# Patient Record
Sex: Female | Born: 1990 | Hispanic: Yes | Marital: Married | Smoking: Current some day smoker
Health system: Southern US, Community
[De-identification: ages and names within clinical notes are randomized; demographics above are authoritative.]

## PROBLEM LIST (undated history)

## (undated) DIAGNOSIS — Z789 Other specified health status: Secondary | ICD-10-CM

## (undated) HISTORY — PX: NO PAST SURGERIES: SHX2092

## (undated) HISTORY — DX: Other specified health status: Z78.9

---

## 2020-11-08 ENCOUNTER — Emergency Department (HOSPITAL_COMMUNITY)
Admission: EM | Admit: 2020-11-08 | Discharge: 2020-11-09 | Disposition: A | Discharge status: Home / Self Care | Payer: Self-pay | Attending: Emergency Medicine | Admitting: Emergency Medicine

## 2020-11-08 ENCOUNTER — Emergency Department (HOSPITAL_COMMUNITY): Payer: Self-pay

## 2020-11-08 DIAGNOSIS — N3090 Cystitis, unspecified without hematuria: Secondary | ICD-10-CM | POA: Insufficient documentation

## 2020-11-08 DIAGNOSIS — N760 Acute vaginitis: Secondary | ICD-10-CM | POA: Insufficient documentation

## 2020-11-08 DIAGNOSIS — N83201 Unspecified ovarian cyst, right side: Secondary | ICD-10-CM | POA: Insufficient documentation

## 2020-11-08 DIAGNOSIS — R1032 Left lower quadrant pain: Secondary | ICD-10-CM | POA: Insufficient documentation

## 2020-11-08 DIAGNOSIS — B9689 Other specified bacterial agents as the cause of diseases classified elsewhere: Secondary | ICD-10-CM | POA: Insufficient documentation

## 2020-11-08 DIAGNOSIS — M545 Low back pain, unspecified: Secondary | ICD-10-CM | POA: Insufficient documentation

## 2020-11-08 LAB — URINALYSIS, ROUTINE W REFLEX MICROSCOPIC
Bilirubin Urine: NEGATIVE
Glucose, UA: NEGATIVE mg/dL
Ketones, ur: NEGATIVE mg/dL
Nitrite: NEGATIVE
Protein, ur: NEGATIVE mg/dL
Specific Gravity, Urine: 1.02 (ref 1.005–1.030)
pH: 5 (ref 5.0–8.0)

## 2020-11-08 LAB — CBC WITH DIFFERENTIAL/PLATELET
Abs Immature Granulocytes: 0.03 10*3/uL (ref 0.00–0.07)
Basophils Absolute: 0.1 10*3/uL (ref 0.0–0.1)
Basophils Relative: 1 %
Eosinophils Absolute: 0.2 10*3/uL (ref 0.0–0.5)
Eosinophils Relative: 2 %
HCT: 40.3 % (ref 36.0–46.0)
Hemoglobin: 13 g/dL (ref 12.0–15.0)
Immature Granulocytes: 0 %
Lymphocytes Relative: 39 %
Lymphs Abs: 3.5 10*3/uL (ref 0.7–4.0)
MCH: 27.1 pg (ref 26.0–34.0)
MCHC: 32.3 g/dL (ref 30.0–36.0)
MCV: 84 fL (ref 80.0–100.0)
Monocytes Absolute: 0.4 10*3/uL (ref 0.1–1.0)
Monocytes Relative: 4 %
Neutro Abs: 4.9 10*3/uL (ref 1.7–7.7)
Neutrophils Relative %: 54 %
Platelets: 276 10*3/uL (ref 150–400)
RBC: 4.8 MIL/uL (ref 3.87–5.11)
RDW: 13.7 % (ref 11.5–15.5)
WBC: 9.1 10*3/uL (ref 4.0–10.5)
nRBC: 0 % (ref 0.0–0.2)

## 2020-11-08 LAB — WET PREP, GENITAL
Sperm: NONE SEEN
Trich, Wet Prep: NONE SEEN
Yeast Wet Prep HPF POC: NONE SEEN

## 2020-11-08 LAB — COMPREHENSIVE METABOLIC PANEL
ALT: 19 U/L (ref 0–44)
AST: 19 U/L (ref 15–41)
Albumin: 3.5 g/dL (ref 3.5–5.0)
Alkaline Phosphatase: 67 U/L (ref 38–126)
Anion gap: 9 (ref 5–15)
BUN: 8 mg/dL (ref 6–20)
CO2: 24 mmol/L (ref 22–32)
Calcium: 8.8 mg/dL — ABNORMAL LOW (ref 8.9–10.3)
Chloride: 103 mmol/L (ref 98–111)
Creatinine, Ser: 0.56 mg/dL (ref 0.44–1.00)
GFR, Estimated: >60 mL/min (ref >60)
Glucose, Bld: 94 mg/dL (ref 70–99)
Potassium: 4.1 mmol/L (ref 3.5–5.1)
Sodium: 136 mmol/L (ref 135–145)
Total Bilirubin: 0.6 mg/dL (ref 0.3–1.2)
Total Protein: 6.7 g/dL (ref 6.5–8.1)

## 2020-11-08 LAB — LIPASE, BLOOD: Lipase: 29 U/L (ref 11–51)

## 2020-11-08 LAB — I-STAT BETA HCG BLOOD, ED (MC, WL, AP ONLY): I-stat hCG, quantitative: <5 m[IU]/mL (ref <5)

## 2020-11-08 MED ORDER — KETOROLAC TROMETHAMINE 60 MG/2ML IM SOLN
60.0000 mg | Freq: Once | INTRAMUSCULAR | Status: AC
  Administered 2020-11-09: 60 mg via INTRAMUSCULAR
  Filled 2020-11-08: qty 2

## 2020-11-08 NOTE — ED Provider Notes (Signed)
Emergency Medicine Provider Triage Evaluation Note  Chloe Sullivan , a 30 y.o. female  was evaluated in triage.  Pt complains of LLQ abdominal pain x2 weeks associated with increased white vaginal discharge and urinary frequency. No previous abdominal operations. She is currently sexually active with 1 partner with no concerns for STDs. No fever or chills. Admits to nausea, but denies emesis and diarrhea.   Review of Systems  Positive: Abdominal pain, nausea Negative: fever  Physical Exam  BP 134/81   Pulse 61   Temp 98.2 F (36.8 C) (Oral)   Resp 14   SpO2 99%  Gen:   Awake, no distress   Resp:  Normal effort  MSK:   Moves extremities without difficulty  Other:  Mild LLQ TTP without rebound or guarding  Medical Decision Making  Medically screening exam initiated at 6:30 PM.  Appropriate orders placed.  Aliyha Franqui was informed that the remainder of the evaluation will be completed by another provider, this initial triage assessment does not replace that evaluation, and the importance of remaining in the ED until their evaluation is complete.  Abdominal labs ordered.   Jesusita Oka 11/08/20 1831    Linwood Dibbles, MD 11/11/20 [phone]

## 2020-11-08 NOTE — ED Triage Notes (Signed)
Pt c/o LLQ abd pain x2wks, lower back pain, urinary frequency. OTC helps w mild pain7/10 pressure LLQ

## 2020-11-08 NOTE — ED Provider Notes (Signed)
[facility] EMERGENCY DEPARTMENT Provider Note   CSN: [id number] Arrival date & time: 11/08/20  1807     History Chief Complaint  Patient presents with   Abdominal Pain    Chloe Sullivan is a 30 y.o. female.  The history is provided by the patient.  Abdominal Pain Chloe Sullivan is a 30 y.o. female who presents to the Emergency Department complaining of abdominal pain. She presents the emergency department complaining of two weeks of left lower quadrant abdominal pain. Pain is cramping in nature and radiates to her left side low back. She reports two days of associated urinary frequency as well as white vaginal discharge. No fevers, nausea, vomiting. She has no known medical problems and takes no medications. She does have a history of similar pain in the past related to a tubal pregnancy. No new sexual partners.    No past medical history on file.  There are no problems to display for this patient.      OB History   No obstetric history on file.     No family history on file.     Home Medications Prior to Admission medications   Medication Sig Start Date End Date Taking? Authorizing Provider  metroNIDAZOLE (FLAGYL) 500 MG tablet Take 1 tablet (500 mg total) by mouth 2 (two) times daily. 11/09/20  Yes Tilden Fossa, MD  nitrofurantoin, macrocrystal-monohydrate, (MACROBID) 100 MG capsule Take 1 capsule (100 mg total) by mouth 2 (two) times daily. 11/09/20  Yes Tilden Fossa, MD    Allergies    Patient has no allergy information on record.  Review of Systems   Review of Systems  Gastrointestinal:  Positive for abdominal pain.  All other systems reviewed and are negative.  Physical Exam Updated Vital Signs BP 114/75   Pulse 69   Temp 98.2 F (36.8 C) (Oral)   Resp (!) 26   SpO2 99%   Physical Exam Vitals and nursing note reviewed.  Constitutional:      Appearance: She is well-developed.  HENT:     Head: Normocephalic and  atraumatic.  Cardiovascular:     Rate and Rhythm: Normal rate and regular rhythm.  Pulmonary:     Effort: Pulmonary effort is normal. No respiratory distress.  Abdominal:     Palpations: Abdomen is soft.     Tenderness: There is no guarding or rebound.     Comments: Mild LLQ and suprapubic tenderness  Genitourinary:    Comments: Scant vaginal bleeding.  No CMT. Minimal left adnexal tenderness without fullness Musculoskeletal:        General: No tenderness.  Skin:    General: Skin is warm and dry.  Neurological:     Mental Status: She is alert and oriented to person, place, and time.  Psychiatric:        Behavior: Behavior normal.    ED Results / Procedures / Treatments   Labs (all labs ordered are listed, but only abnormal results are displayed) Labs Reviewed  WET PREP, GENITAL - Abnormal; Notable for the following components:      Result Value   Clue Cells Wet Prep HPF POC PRESENT (*)    WBC, Wet Prep HPF POC MANY (*)    All other components within normal limits  URINALYSIS, ROUTINE W REFLEX MICROSCOPIC - Abnormal; Notable for the following components:   APPearance HAZY (*)    Hgb urine dipstick LARGE (*)    Leukocytes,Ua SMALL (*)    Bacteria, UA RARE (*)  All other components within normal limits  COMPREHENSIVE METABOLIC PANEL - Abnormal; Notable for the following components:   Calcium 8.8 (*)    All other components within normal limits  CBC WITH DIFFERENTIAL/PLATELET  LIPASE, BLOOD  I-STAT BETA HCG BLOOD, ED ([facility], [facility], [facility] ONLY)  GC/CHLAMYDIA PROBE AMP ([place]) NOT AT [facility]    EKG None  Radiology CT Renal Stone Study  Result Date: 11/08/2020 CLINICAL DATA:  Flank pain, renal stone suspected. EXAM: CT ABDOMEN AND PELVIS WITHOUT CONTRAST TECHNIQUE: Multidetector CT imaging of the abdomen and pelvis was performed following the standard protocol without IV contrast. COMPARISON:  None. FINDINGS: Lower chest: Hypoventilatory change in the dependent lungs.  Hepatobiliary: Unremarkable noncontrast appearance of the hepatic parenchyma. Gallbladder is unremarkable. No biliary ductal dilation. Pancreas: Within normal limits. Spleen: Within normal. Adrenals/Urinary Tract: Adrenal glands are unremarkable. Kidneys are normal, without renal calculi, contour deforming lesion, or hydronephrosis. Bladder is unremarkable for degree of distension. Stomach/Bowel: Stomach is within normal limits. Appendix appears normal. No evidence of bowel wall thickening, distention, or inflammatory changes. Vascular/Lymphatic: No significant vascular findings are present. No enlarged abdominal or pelvic lymph nodes. Reproductive: Uterus is unremarkable. Multiple small cystic areas in the cervix measuring to 1.2 cm likely representing nabothian cysts. Left ovary is unremarkable. Simple appearing 4.3 cm right ovarian cyst. No follow-up imaging recommended. Note: This recommendation does not apply to premenarchal patients and to those with increased risk (genetic, family history, elevated tumor markers or other high-risk factors) of ovarian cancer. Reference: JACR 2020 Feb; 17(2):248-254 Other: No abdominopelvic ascites. Musculoskeletal: No acute or significant osseous findings. IMPRESSION: 1. No acute abdominopelvic findings. Specifically, no evidence of nephrolithiasis or obstructive uropathy. 2. Simple appearing 4.3 cm right ovarian cyst. No follow-up imaging recommended. Note: This recommendation does not apply to premenarchal patients and to those with increased risk (genetic, family history, elevated tumor markers or other high-risk factors) of ovarian cancer. Reference: JACR 2020 Feb; 17(2):248-254 Electronically Signed   By: Maudry Mayhew MD   On: 11/08/2020 23:49    Procedures Procedures   Medications Ordered in ED Medications  ketorolac (TORADOL) injection 60 mg (has no administration in time range)    ED Course  I have reviewed the triage vital signs and the nursing  notes.  Pertinent labs & imaging results that were available during my care of the patient were reviewed by me and considered in my medical decision making (see chart for details).    MDM Rules/Calculators/A&P                         patient here for evaluation of two weeks of left lower quadrant pain that radiates to her back as well as two days of vaginally discharge. She does have mild urinary frequency. UA with few bacteria WBCs in setting of symptoms will treat for cystitis. CT scan was obtained to rule out stone. CT scan does demonstrate a right ovarian cyst. Presentation is not consistent with tubo ovarian abscess or torsion. What prep is significant for BV - will treat. Discussed with patient home care for cystitis, BV and ovarian cyst. Discussed outpatient follow-up and return precautions.  Final Clinical Impression(s) / ED Diagnoses Final diagnoses:  Cystitis  BV (bacterial vaginosis)  Cyst of right ovary    Rx / DC Orders ED Discharge Orders          Ordered    nitrofurantoin, macrocrystal-monohydrate, (MACROBID) 100 MG capsule  2 times daily  11/09/20 0005    metroNIDAZOLE (FLAGYL) 500 MG tablet  2 times daily        11/09/20 0005             Tilden Fossa, MD 11/09/20 0009

## 2020-11-09 LAB — GC/CHLAMYDIA PROBE AMP (~~LOC~~) NOT AT ARMC
Chlamydia: NEGATIVE
Comment: NEGATIVE
Comment: NORMAL
Neisseria Gonorrhea: NEGATIVE

## 2020-11-09 MED ORDER — NITROFURANTOIN MONOHYD MACRO 100 MG PO CAPS: 100.0000 mg | ORAL_CAPSULE | Freq: Two times a day (BID) | ORAL | 0 refills | Status: DC

## 2020-11-09 MED ORDER — METRONIDAZOLE 500 MG PO TABS: 500.0000 mg | ORAL_TABLET | Freq: Two times a day (BID) | ORAL | 0 refills | Status: DC

## 2020-11-12 ENCOUNTER — Ambulatory Visit (INDEPENDENT_AMBULATORY_CARE_PROVIDER_SITE_OTHER): Payer: Self-pay | Admitting: Family Medicine

## 2020-11-12 ENCOUNTER — Other Ambulatory Visit: Payer: Self-pay

## 2020-11-12 VITALS — HR 74 | Ht 65.0 in | Wt 248.6 lb

## 2020-11-12 DIAGNOSIS — Z3169 Encounter for other general counseling and advice on procreation: Secondary | ICD-10-CM

## 2020-11-12 DIAGNOSIS — G43919 Migraine, unspecified, intractable, without status migrainosus: Secondary | ICD-10-CM

## 2020-11-12 DIAGNOSIS — N83201 Unspecified ovarian cyst, right side: Secondary | ICD-10-CM

## 2020-11-12 DIAGNOSIS — Z6841 Body Mass Index (BMI) 40.0 and over, adult: Secondary | ICD-10-CM

## 2020-11-12 MED ORDER — MAGNESIUM 400 MG PO CAPS: 400.0000 mg | ORAL_CAPSULE | Freq: Every day | ORAL | 0 refills | Status: DC | PRN

## 2020-11-12 NOTE — Progress Notes (Signed)
    SUBJECTIVE:   Chief compliant/HPI: annual examination  Chloe Sullivan is a 30 y.o. who presents today to establish care.   She reports she has been trying to get pregnant for past 3 years. Had hx of ectopic pregnancy in 2017. States she has a 72 year old son. Partner has not been evaluated  Endorses cyst in R ovary but L side has been bothering her.   Hx of migraines sicne 45-51 years old. Takes Ibuprofen as needed for pain. Has about 3 migraines a week that last 20-30 minutes. Feels nauseous during episodes, occasionally throws up.Worsened with light and sound. Location differs- sometimes behind eyes, sometimes 1 side of head   Endorses Cigarettes 2-3 a month for less than 6 months. I beer when she drinks socialy . No recreational driug use  Physical activity during the week with walking and running Eats balanced meals    Denies anxiety or depression   Last pap smear in 2020  No hx breast cancer   Denies taking any medication PMH: mom with high cholesterol   OBJECTIVE:   Pulse 74   Ht 5\' 5"  (1.651 m)   Wt 248 lb 9.6 oz (112.8 kg)   LMP 11/06/2020   SpO2 98%   BMI 41.37 kg/m   General: alert, well appearing, NAD CV: RRR no murmurs Resp: CTAB normal WOB GI: soft, non distended   ASSESSMENT/PLAN:   No problem-specific Assessment & Plan notes found for this encounter.   Annual Examination  See AVS for age appropriate recommendations.   PHQ score reviewed and discussed. Blood pressure reviewed and at goal.  The patient currently does not use contraception and is trying to get pregnant. Will recommend folate at follow up visit, minimum of 400 mcg per day.   Considered the following items based upon USPSTF recommendations: HIV testing: ordered Hepatitis C: ordered Syphilis if at high risk: N/A GC/CT not at high risk and not ordered. Lipid panel (nonfasting or fasting) discussed based upon AHA recommendations and ordered.  Consider repeat every 4-6 years.   Reviewed risk factors for latent tuberculosis and not indicated  Cervical cancer screening: Awaiting records from prior pap. If unable to obtain, discussed doing pap at follow up  visit  Immunizations: none   Migraines Patient reports about 3 a week that cause nausea, vomiting, and significant pain. Not relieved with Ibuprofen. Prescribed magnesium 400mg  daily initially, and as headaches improve to take it as needed   Infertility  Patient has been trying for 3 years to get pregnant. Checking TSH. Referring to Reproductive Endocrinology   Health maintenance A1c and lipid panel given risk factors of obesity and family history. Also checking HIV and Hep C  Follow up in 2-4 weeks for pap smear    01/07/2021, DO [facility]

## 2020-11-12 NOTE — Patient Instructions (Addendum)
It was great seeing you today!  For your migraine I have prescribed magnesium to take daily for the first few days, and then you can switch to as needed for your migraines.  I have referred you to a fertility specialist for you and your husband to see.   We are checking some labs today: A1c to check your sugar, lipid panel for cholesterol, and TSH to check your thyroid. We are also taking care of your recommended screenigs: HIV, Hepatitis C  Please check-out at the front desk before leaving the clinic. I'd like to see you back in the next couple of weeks to do your pap smear, go over labs, and finish anything we did not get to etoday, but if you need to be seen earlier than that for any new issues we're happy to fit you in, just give [place] a call!  Visit Reminders: - Stop by the pharmacy to pick up your prescriptions  - Continue to work on your healthy eating habits and incorporating exercise into your daily life.   If you haven't already, sign up for My Chart to have easy access to your labs results, and communication with your primary care physician.  Feel free to call with any questions or concerns at any time, at [phone].   Take care,  Dr. Cora Collum [facility]

## 2020-11-13 LAB — LIPID PANEL
Chol/HDL Ratio: 4.3 ratio (ref 0.0–4.4)
Cholesterol, Total: 206 mg/dL — ABNORMAL HIGH (ref 100–199)
HDL: 48 mg/dL (ref >39)
LDL Chol Calc (NIH): 132 mg/dL — ABNORMAL HIGH (ref 0–99)
Triglycerides: 148 mg/dL (ref 0–149)
VLDL Cholesterol Cal: 26 mg/dL (ref 5–40)

## 2020-11-13 LAB — HCV INTERPRETATION

## 2020-11-13 LAB — HEMOGLOBIN A1C
Est. average glucose Bld gHb Est-mCnc: 111 mg/dL
Hgb A1c MFr Bld: 5.5 % (ref 4.8–5.6)

## 2020-11-13 LAB — HIV ANTIBODY (ROUTINE TESTING W REFLEX): HIV Screen 4th Generation wRfx: NONREACTIVE

## 2020-11-13 LAB — HCV AB W REFLEX TO QUANT PCR: HCV Ab: 0.2 s/co ratio (ref 0.0–0.9)

## 2020-11-13 LAB — TSH: TSH: 2.87 u[IU]/mL (ref 0.450–4.500)

## 2020-11-14 DIAGNOSIS — G43909 Migraine, unspecified, not intractable, without status migrainosus: Secondary | ICD-10-CM | POA: Insufficient documentation

## 2020-11-14 DIAGNOSIS — N83209 Unspecified ovarian cyst, unspecified side: Secondary | ICD-10-CM | POA: Insufficient documentation

## 2020-12-04 NOTE — Progress Notes (Signed)
    SUBJECTIVE:   CHIEF COMPLAINT / HPI:   Chloe Sullivan is a 30 yo who presents for pap smear. She still states she has ongoing L sided pain that radiates to her back for the past 4 weeks. Endorses burning with urinatinon, frequency, urgency.  Denies issues with bowel movements.  Completed Macrobid and Flagyl but still having flank pain and urinary symptoms. States she was having discharge prior to abx but that has resolved. States LMP on 7/11. Took pregnancy test 2 days ago and was negative. States her last cycle was late 5 days.    OBJECTIVE:   BP 127/63   Pulse 61   Wt 251 lb 3.2 oz (113.9 kg)   LMP 11/06/2020   SpO2 99%   BMI 41.80 kg/m    Physical exam  General: well appearing, NAD Cardiovascular: RRR, no murmurs Lungs: CTAB. Normal WOB Abdomen: soft, non-distended. Tender to palpation in LLQ without guarding or rebound tenderness. No CVA tenderness  Skin: warm, dry. No edema  GU: normal external vaginal tissue. Vulva normal. Cevix with mild erythema around cervical os without clear lesions or discharge. Manual exam tender on L side   ASSESSMENT/PLAN:   No problem-specific Assessment & Plan notes found for this encounter.  Dysuria, frequency, urgency, L flank pain Was recently treated for UTI a couple of weeks ago with Macrobid, but still having urinary symptoms and flank pain.  Symptoms potentially due to untreated UTI, or STIs though recently negative. Performed UA in clinic which did show some leukocytes, and will treat with Keflex twice a day for 7 days.  Pregnancy test performed which was negative.  Advised patient to let me know if symptoms persist despite taking this antibiotic.  Pap smear performed without any abnormal lesions on exam. GC and wet prep performed as well.   Follow up in 1 month unless symptoms worsen and needs to be seen sooner   Cora Collum, DO [facility]

## 2020-12-05 ENCOUNTER — Other Ambulatory Visit (HOSPITAL_COMMUNITY)
Admission: RE | Admit: 2020-12-05 | Discharge: 2020-12-05 | Disposition: A | Discharge status: Home / Self Care | Payer: Medicaid Other | Source: Ambulatory Visit | Attending: Family Medicine | Admitting: Family Medicine

## 2020-12-05 ENCOUNTER — Ambulatory Visit (INDEPENDENT_AMBULATORY_CARE_PROVIDER_SITE_OTHER): Payer: Self-pay | Admitting: Family Medicine

## 2020-12-05 ENCOUNTER — Other Ambulatory Visit: Payer: Self-pay

## 2020-12-05 VITALS — BP 127/63 | HR 61 | Wt 251.2 lb

## 2020-12-05 DIAGNOSIS — R300 Dysuria: Secondary | ICD-10-CM | POA: Insufficient documentation

## 2020-12-05 LAB — POCT URINALYSIS DIP (MANUAL ENTRY)
Bilirubin, UA: NEGATIVE
Glucose, UA: NEGATIVE mg/dL
Ketones, POC UA: NEGATIVE mg/dL
Nitrite, UA: NEGATIVE
Protein Ur, POC: NEGATIVE mg/dL
Spec Grav, UA: 1.025 (ref 1.010–1.025)
Urobilinogen, UA: 0.2 E.U./dL
pH, UA: 6 (ref 5.0–8.0)

## 2020-12-05 LAB — POCT WET PREP (WET MOUNT)
Clue Cells Wet Prep Whiff POC: NEGATIVE
Trichomonas Wet Prep HPF POC: ABSENT

## 2020-12-05 LAB — POCT URINE PREGNANCY: Preg Test, Ur: NEGATIVE

## 2020-12-05 LAB — POCT UA - MICROSCOPIC ONLY

## 2020-12-05 NOTE — Patient Instructions (Addendum)
It was great seeing you today! Today you came in for your pap smear and STI testing. I also checked your urine for potential untreated infection. I will call you once I have received all of the results and will send treatment accordingly.   Please check-out at the front desk before leaving the clinic. I'd like to see you back in about 1 month for a follow up on your pain, but if you need to be seen earlier than that for any new issues we're happy to fit you in, just give [place] a call!  If you haven't already, sign up for My Chart to have easy access to your labs results, and communication with your primary care physician.  Feel free to call with any questions or concerns at any time, at [phone].   Take care,  Dr. Cora Collum [facility]

## 2020-12-06 MED ORDER — CEPHALEXIN 500 MG PO CAPS: 500.0000 mg | ORAL_CAPSULE | Freq: Two times a day (BID) | ORAL | 0 refills | Status: AC

## 2020-12-07 LAB — CYTOLOGY - PAP
Chlamydia: NEGATIVE
Comment: NEGATIVE
Comment: NEGATIVE
Comment: NORMAL
Diagnosis: NEGATIVE
High risk HPV: NEGATIVE
Neisseria Gonorrhea: NEGATIVE

## 2020-12-08 LAB — URINE CULTURE

## 2021-01-08 ENCOUNTER — Ambulatory Visit (INDEPENDENT_AMBULATORY_CARE_PROVIDER_SITE_OTHER): Payer: Self-pay | Admitting: Family Medicine

## 2021-01-08 ENCOUNTER — Other Ambulatory Visit: Payer: Self-pay

## 2021-01-08 ENCOUNTER — Encounter: Payer: Self-pay | Admitting: Family Medicine

## 2021-01-08 VITALS — BP 115/61 | HR 72 | Ht 65.0 in | Wt 252.0 lb

## 2021-01-08 DIAGNOSIS — N912 Amenorrhea, unspecified: Secondary | ICD-10-CM

## 2021-01-08 DIAGNOSIS — N926 Irregular menstruation, unspecified: Secondary | ICD-10-CM

## 2021-01-08 LAB — POCT URINE PREGNANCY: Preg Test, Ur: NEGATIVE

## 2021-01-08 NOTE — Patient Instructions (Signed)
Secondary Amenorrhea  Secondary amenorrhea occurs when a female who was previously having menstrual periods has not had them for 3-6 months. A menstrual period is the monthly shedding of the lining of the uterus. The lining of the uterus is made up of blood, tissue, fluid, and mucus. The flow of blood usually occurs during 3-7consecutive days each month. This condition has many causes. In many cases, treating the underlying causewill return menstrual periods back to a normal cycle. What are the causes? The most common cause of this condition is pregnancy. Other medical conditions that can cause secondary amenorrhea include: Cirrhosis of the liver. Conditions of the blood. Diabetes. Epilepsy. Chronic kidney disease. Polycystic ovary disease. A hormonal imbalance. Ovarian failure. Cystic fibrosis. Early menopause. Cushing syndrome. Thyroid problems. Other causes may include: Malnutrition. Stress or anxiety. Medicines. Extreme obesity. Low body weight or drastic weight loss. Removal of the ovaries or uterus. Contraceptive pills, patches, or vaginal rings. What increases the risk? You are more likely to develop this condition if: You have a family history of this condition. You have an eating disorder. You do extreme athletic training. You have a chronic disease. You abuse substances such as alcohol or cigarettes. What are the signs or symptoms? The main symptom of this condition is a lack of menstrual periods for 3-6months in a female who previously had menstrual periods. How is this diagnosed? This condition may be diagnosed based on: Your medical history. A physical exam. A pelvic exam to check for problems with your reproductive organs. A procedure to examine the uterus. A measurement of your body mass index (BMI). You may also have other tests, including: Blood tests that measure certain hormones in your body and rule out pregnancy. Urine tests. Imaging tests, such as an  ultrasound, CT scan, or MRI. How is this treated? Treatment for this condition depends on the cause of the amenorrhea. It may involve: Correcting diet-related problems. Treating underlying conditions. Medicines. Lifestyle changes. Surgery. If the condition cannot be corrected, it is sometimes possible to startmenstrual periods with medicines. Follow these instructions at home: Lifestyle     Maintain a healthy diet. In general, a healthy diet includes lots of fruits and vegetables, low-fat dairy products, lean meats, and foods that contain fiber. Ask to meet with a registered dietitian for nutrition counseling and meal planning. Maintain a healthy weight. Talk to your health care provider before trying any new diet or exercise plan. Exercise at least 30 minutes 5 or more days each week. Exercising includes brisk walking, yard work, biking, running, swimming, and team sports like basketball and soccer. Ask your health care provider which exercises are safe for you. Get enough sleep. Plan your sleep time to allow for 7-9 hours of sleep each night. Learn to manage stress. Explore relaxation techniques such as meditation, journaling, yoga, or tai chi. General instructions Be aware of changes in your menstrual cycle. Keep a record of when you have your menstrual period. Note the date your period starts, how long it lasts, and any problems you experience. Take over-the-counter and prescription medicines only as told by your health care provider. Keep all follow-up visits. This is important. Contact a health care provider if: Your periods do not return to normal after treatment. Summary Secondary amenorrhea is when a female who was previously having menstrual periods has not gotten her period for 3-6 months. This condition has many causes. In many cases, treating the underlying cause will return menstrual periods back to a normal cycle. Talk to   to your health care provider if your periods do not  return to normal after treatment. This information is not intended to replace advice given to you by your health care provider. Make sure you discuss any questions you have with your health care provider. Document Revised: 11/30/2019 Document Reviewed: 11/30/2019 Elsevier Patient Education  2022 [employer].

## 2021-01-08 NOTE — Assessment & Plan Note (Addendum)
Hx of infertility. Currently not on any birth control. No previous work up in the past for infertility or irregular menses. Upreg negative today. Hormone work up initiated today. Consider pelvic US in the future. I offered pelvic exam today. However, she declined stating she had one done recently. STI screening result reviewed and were negative. I will contact her soon with results.

## 2021-01-08 NOTE — Progress Notes (Signed)
    SUBJECTIVE:   CHIEF COMPLAINT / HPI:   Amenorrhea:  30 Y/O G2P1 with hx of early trimester miscarriage at four weeks GA in 2017 presents with hx of a missed period for > 2 months. Her LMP was 11/05/20. She denies ever being on birth control. She is sexually active with the same partner of three years. No hx of STDs. Her period has not always been regular. However, this is the first time her period would go away altogether.  She has hx of infertility, trying to conceive for 3 yrs. No abnormal weight changes or galactorrhea. She has hx of chronic Migraine. She has been wearing glasses for more than 5 yrs and feels her vision is weaker. Intermittent left pelvic pain, and she had a CT scan that showed a right ovarian cyst. She also endorses intermittent clear liquid drainage from her vagina x 1 month. She had a pelvic exam with an STD screen a month ago, which was non-revealing. She denies painful intercourse, no dysuria.  PERTINENT  PMH / PSH: PMX reviewed  OBJECTIVE:   Vitals:   01/08/21 0959  BP: 115/61  Pulse: 72  Weight: 252 lb (114.3 kg)  Height: 5\' 5"  (1.651 m)    Physical Exam Vitals and nursing note reviewed.  Cardiovascular:     Rate and Rhythm: Normal rate and regular rhythm.     Heart sounds: Normal heart sounds. No murmur heard. Pulmonary:     Effort: Pulmonary effort is normal. No respiratory distress.     Breath sounds: Normal breath sounds. No wheezing.  Abdominal:     General: Abdomen is flat. Bowel sounds are normal. There is no distension.     Palpations: Abdomen is soft. There is no mass.     Tenderness: There is no abdominal tenderness.  Musculoskeletal:     Right lower leg: No edema.     Left lower leg: No edema.   Flowsheet Row Office Visit from 01/08/2021 in [facility]  PHQ-9 Total Score 0        ASSESSMENT/PLAN:   Amenorrhea Hx of infertility. Currently not on any birth control. No previous work up in the past for  infertility or irregular menses. Upreg negative today. Hormone work up initiated today. Consider pelvic [place] in the future. I offered pelvic exam today. However, she declined stating she had one done recently. STI screening result reviewed and were negative. I will contact her soon with results.    [place], MD [facility]

## 2021-01-09 ENCOUNTER — Telehealth: Payer: Self-pay | Admitting: Family Medicine

## 2021-01-09 DIAGNOSIS — G4489 Other headache syndrome: Secondary | ICD-10-CM

## 2021-01-09 DIAGNOSIS — H539 Unspecified visual disturbance: Secondary | ICD-10-CM

## 2021-01-09 DIAGNOSIS — G8929 Other chronic pain: Secondary | ICD-10-CM

## 2021-01-09 DIAGNOSIS — R7989 Other specified abnormal findings of blood chemistry: Secondary | ICD-10-CM

## 2021-01-09 NOTE — Telephone Encounter (Signed)
Spoke with patient informed of MRI appt. Patient understood. Aquilla Solian, CMA

## 2021-01-09 NOTE — Telephone Encounter (Signed)
Test results discussed. Estrogen level is still pending.  Result so far with her symptoms is suggestive of prolactinoma, although the prolactin level is only slightly elevated. FSH is low which supports prolactinoma with low normal LH.  Options given to  Recheck prolactin level Or work up for prolactinoma.  She stated that she has been having issues with work finding difficulty on and off for months as well as worsening vision, hx of chronic headache/migraine with amenorrhea. Hence, she opted for work-up and referral to endocrinology. Consider Gyn referral in the future.   She will contact her insurance regarding ophthalmology coverage and I will place referral for ophtho and endo.  She agreed with the plan.

## 2021-01-09 NOTE — Telephone Encounter (Signed)
MRI at [facility] on Mon Sep. 26th at 11:30. Aquilla Solian, CMA

## 2021-01-11 LAB — ESTROGENS, TOTAL: Estrogen: 241 pg/mL

## 2021-01-11 LAB — DHEA-SULFATE: DHEA-SO4: 114 ug/dL (ref 84.8–378.0)

## 2021-01-11 LAB — LUTEINIZING HORMONE: LH: 2 m[IU]/mL

## 2021-01-11 LAB — FOLLICLE STIMULATING HORMONE: FSH: 0.8 m[IU]/mL

## 2021-01-11 LAB — PROLACTIN: Prolactin: 26.4 ng/mL — ABNORMAL HIGH (ref 4.8–23.3)

## 2021-01-21 ENCOUNTER — Other Ambulatory Visit: Payer: Self-pay

## 2021-01-21 ENCOUNTER — Ambulatory Visit (HOSPITAL_COMMUNITY)
Admission: RE | Admit: 2021-01-21 | Discharge: 2021-01-21 | Disposition: A | Discharge status: Home / Self Care | Payer: Self-pay | Source: Ambulatory Visit | Attending: Family Medicine | Admitting: Family Medicine

## 2021-01-21 DIAGNOSIS — H539 Unspecified visual disturbance: Secondary | ICD-10-CM | POA: Insufficient documentation

## 2021-01-21 DIAGNOSIS — R7989 Other specified abnormal findings of blood chemistry: Secondary | ICD-10-CM | POA: Insufficient documentation

## 2021-01-21 MED ORDER — GADOBUTROL 1 MMOL/ML IV SOLN
7.5000 mL | Freq: Once | INTRAVENOUS | Status: AC | PRN
  Administered 2021-01-21: 7.5 mL via INTRAVENOUS

## 2021-01-22 ENCOUNTER — Telehealth: Payer: Self-pay | Admitting: Family Medicine

## 2021-01-22 DIAGNOSIS — R471 Dysarthria and anarthria: Secondary | ICD-10-CM

## 2021-01-22 DIAGNOSIS — R9089 Other abnormal findings on diagnostic imaging of central nervous system: Secondary | ICD-10-CM

## 2021-01-22 MED ORDER — ASPIRIN EC 81 MG PO TBEC: 81.0000 mg | DELAYED_RELEASE_TABLET | Freq: Every day | ORAL | 0 refills | Status: DC

## 2021-01-22 NOTE — Telephone Encounter (Signed)
HIPAA compliant callback message left. I will like to discuss her MRI report with her when she calls.   MR Brain W Wo Contrast  Result Date: 01/22/2021 CLINICAL DATA:  Infertility. Galactorrhea amenorrhea. Elevated prolactin level. EXAM: MRI HEAD WITHOUT AND WITH CONTRAST TECHNIQUE: Multiplanar, multiecho pulse sequences of the brain and surrounding structures were obtained without and with intravenous contrast. CONTRAST:  7.22mL GADAVIST GADOBUTROL 1 MMOL/ML IV SOLN COMPARISON:  None. FINDINGS: Brain: Dynamic pituitary protocol. Pituitary not enlarged. Homogeneous enhancement of the pituitary gland. No microadenoma. Infundibulum midline. Cavernous sinus normal. Optic chiasm normal. Ventricle size normal. Negative for acute infarct, hemorrhage, mass. Few small deep white matter hyperintensities bilaterally, nonspecific and mild. Vascular: Normal arterial flow voids. Skull and upper cervical spine: No focal skeletal lesion Sinuses/Orbits: Moderate mucosal edema paranasal sinuses. Negative orbit Other: None IMPRESSION: Normal pituitary. No pituitary enlargement or micro adenoma identified Few small deep white matter hyperintensities bilaterally, nonspecific. Correlate with history of risk factors for small vessel ischemia. Correlate with history of migraine headaches. Pattern not typical for demyelinating disease. Electronically Signed   By: Marlan Palau M.D.   On: 01/22/2021 09:40

## 2021-01-22 NOTE — Telephone Encounter (Signed)
Patient returns call to nurse line. Patient reports she will have her phone on her for the remainder of the day. Please advise.

## 2021-01-22 NOTE — Telephone Encounter (Addendum)
I called and discussed her MRI result with her. Neg pituitary adenoma. However, it shows a few small deep white matter hyperintensities bilaterally, nonspecific (Ischemia vs. Migrain). She had Hx of Migraine headache and recently endorsed dysarthria. Discussed the option of neurology referral for further evaluation given her symptoms, and she agreed with the plan.  F/U soon or go to the ED if symptoms worsen. In the meantime, I recommended ASA 81 mg qd pending her neurology eval. Med escribed and she agreed with the plan.  For her amenorrhea, her period returned on 9/17 and flowed for four days. We will hold off on further evaluation and management for now. She agreed with the plan.

## 2021-01-22 NOTE — Addendum Note (Signed)
Addended by: Janit Pagan T on: 01/22/2021 03:49 PM   Modules accepted: Orders

## 2021-03-29 ENCOUNTER — Telehealth: Payer: Self-pay | Admitting: Diagnostic Neuroimaging

## 2021-03-29 ENCOUNTER — Ambulatory Visit (INDEPENDENT_AMBULATORY_CARE_PROVIDER_SITE_OTHER): Payer: Self-pay | Admitting: Diagnostic Neuroimaging

## 2021-03-29 ENCOUNTER — Encounter: Payer: Self-pay | Admitting: Diagnostic Neuroimaging

## 2021-03-29 VITALS — BP 122/79 | HR 62 | Ht 67.0 in | Wt 247.0 lb

## 2021-03-29 DIAGNOSIS — G43109 Migraine with aura, not intractable, without status migrainosus: Secondary | ICD-10-CM

## 2021-03-29 MED ORDER — ONDANSETRON HCL 4 MG PO TABS: 4.0000 mg | ORAL_TABLET | Freq: Two times a day (BID) | ORAL | 3 refills | Status: DC | PRN

## 2021-03-29 MED ORDER — RIZATRIPTAN BENZOATE 10 MG PO TBDP: 10.0000 mg | ORAL_TABLET | ORAL | 11 refills | Status: DC | PRN

## 2021-03-29 NOTE — Patient Instructions (Signed)
  MIGRAINE PREVENTION  LIFESTYLE CHANGES -Stop or avoid smoking -Decrease or avoid caffeine / alcohol -Eat and sleep on a regular schedule -Exercise several times per week   MIGRAINE RESCUE  - ibuprofen, tylenol as needed - rizatriptan (Maxalt) 10mg  as needed for breakthrough headache; may repeat x 1 after 2 hours; max 2 tabs per day or 8 per month - ondansetron 4mg  as needed for nausea

## 2021-03-29 NOTE — Progress Notes (Signed)
[facility]  PATIENT: Chloe Sullivan DOB: [birth year]  REFERRING CLINICIAN: Doreene Eland, MD HISTORY FROM: patient  REASON FOR VISIT: new consult   HISTORICAL  CHIEF COMPLAINT:  Chief Complaint  Patient presents with   New Patient (Initial Visit)    Rm 6, alone. Internal referral for dysarthria and abnormal findings on MRI of brain. Pt reports vision is getting weaker. HA have increased and causing her to vomit. 3 HA per month. Taking magnesium for HA    HISTORY OF PRESENT ILLNESS:   30 year old female here for evaluation of headaches and abnormal MRI brain.  Patient has headaches since teenage years with unilateral throbbing, severe sharp pains, lasting 2 hours or longer.  Headaches associated with seeing spots and dots, blurred vision, nausea, sensitive to light and sound.  Patient has been diagnosed with migraine in the past.  This is managed with over-the-counter ibuprofen and going to dark quiet room.  No specific triggering or aggravating factors.  Patient also has had some intermittent slurred speech and word finding difficulties.  No family history of migraine.  No significant change in character or duration, intensity of headaches.  Recently patient had MRI of the brain because of amenorrhea and infertility an elevated prolactin level.  Pituitary gland study was unremarkable.  However a few nonspecific T2 hyperintensities were noted and patient was referred here for further evaluation.    REVIEW OF SYSTEMS: Full 14 system review of systems performed and negative with exception of: As per HPI.  ALLERGIES: Not on File  HOME MEDICATIONS: Outpatient Medications Prior to Visit  Medication Sig Dispense Refill   aspirin EC 81 MG tablet Take 1 tablet (81 mg total) by mouth daily. Swallow whole. 30 tablet 0   Magnesium 400 MG CAPS Take 400 mg by mouth daily as needed. 20 capsule 0   metroNIDAZOLE (FLAGYL) 500 MG tablet Take 1 tablet (500 mg total)  by mouth 2 (two) times daily. 14 tablet 0   No facility-administered medications prior to visit.    PAST MEDICAL HISTORY: History reviewed. No pertinent past medical history.  PAST SURGICAL HISTORY: History reviewed. No pertinent surgical history.  FAMILY HISTORY: Family History  Problem Relation Age of Onset   Diabetes Mother    Arthritis Mother    High blood pressure Mother    High blood pressure Brother    Epilepsy Brother     SOCIAL HISTORY: Social History   Socioeconomic History   Marital status: Married    Spouse name: heberto   Number of children: 1   Years of education: Not on file   Highest education level: High school graduate  Occupational History   Not on file  Tobacco Use   Smoking status: Some Days    Years: 1.00    Types: Cigarettes   Smokeless tobacco: Never  Substance and Sexual Activity   Alcohol use: Yes    Comment: occa   Drug use: Never   Sexual activity: Not on file  Other Topics Concern   Not on file  Social History Narrative   Live at home with family   Right handed   Caffeine: 1 beverage a week   Social Determinants of Health   Financial Resource Strain: Not on file  Food Insecurity: Not on file  Transportation Needs: Not on file  Physical Activity: Not on file  Stress: Not on file  Social Connections: Not on file  Intimate Partner Violence: Not on file     PHYSICAL EXAM  GENERAL EXAM/CONSTITUTIONAL: Vitals:  Vitals:   03/29/21 0914  BP: 122/79  Pulse: 62  Weight: 247 lb (112 kg)  Height: 5\' 7"  (1.702 m)   Body mass index is 38.69 kg/m. Wt Readings from Last 3 Encounters:  03/29/21 247 lb (112 kg)  01/08/21 252 lb (114.3 kg)  12/05/20 251 lb 3.2 oz (113.9 kg)   Patient is in no distress; well developed, nourished and groomed; neck is supple  CARDIOVASCULAR: Examination of carotid arteries is normal; no carotid bruits Regular rate and rhythm, no murmurs Examination of peripheral vascular system by  observation and palpation is normal  EYES: Ophthalmoscopic exam of optic discs and posterior segments is normal; no papilledema or hemorrhages No results found.  MUSCULOSKELETAL: Gait, strength, tone, movements noted in Neurologic exam below  NEUROLOGIC: MENTAL STATUS:  No flowsheet data found. awake, alert, oriented to person, place and time recent and remote memory intact normal attention and concentration language fluent, comprehension intact, naming intact fund of knowledge appropriate  CRANIAL NERVE:  2nd - no papilledema on fundoscopic exam 2nd, 3rd, 4th, 6th - pupils equal and reactive to light, visual fields full to confrontation, extraocular muscles intact, no nystagmus 5th - facial sensation symmetric 7th - facial strength symmetric 8th - hearing intact 9th - palate elevates symmetrically, uvula midline 11th - shoulder shrug symmetric 12th - tongue protrusion midline  MOTOR:  normal bulk and tone, full strength in the BUE, BLE  SENSORY:  normal and symmetric to light touch, pinprick, temperature, vibration  COORDINATION:  finger-nose-finger, fine finger movements normal  REFLEXES:  deep tendon reflexes present and symmetric  GAIT/STATION:  narrow based gait; able to walk on toes, heels and tandem; romberg is negative     DIAGNOSTIC DATA (LABS, IMAGING, TESTING) - I reviewed patient records, labs, notes, testing and imaging myself where available.  Lab Results  Component Value Date   WBC 9.1 11/08/2020   HGB 13.0 11/08/2020   HCT 40.3 11/08/2020   MCV 84.0 11/08/2020   PLT 276 11/08/2020      Component Value Date/Time   NA 136 11/08/2020 1930   K 4.1 11/08/2020 1930   CL 103 11/08/2020 1930   CO2 24 11/08/2020 1930   GLUCOSE 94 11/08/2020 1930   BUN 8 11/08/2020 1930   CREATININE 0.56 11/08/2020 1930   CALCIUM 8.8 (L) 11/08/2020 1930   PROT 6.7 11/08/2020 1930   ALBUMIN 3.5 11/08/2020 1930   AST 19 11/08/2020 1930   ALT 19 11/08/2020 1930    ALKPHOS 67 11/08/2020 1930   BILITOT 0.6 11/08/2020 1930   GFRNONAA >60 11/08/2020 1930   Lab Results  Component Value Date   CHOL 206 (H) 11/12/2020   HDL 48 11/12/2020   LDLCALC 132 (H) 11/12/2020   TRIG 148 11/12/2020   CHOLHDL 4.3 11/12/2020   Lab Results  Component Value Date   HGBA1C 5.5 11/12/2020   No results found for: VITAMINB12 Lab Results  Component Value Date   TSH 2.870 11/12/2020    01/21/2021 MRI brain with and without contrast [I reviewed images myself and agree with interpretation. -[facility]] -Normal pituitary. -Few nonspecific T2 hyperintensities, very minimal and punctate.    ASSESSMENT AND PLAN  30 y.o. year old female here with:   Dx:  1. Migraine with aura and without status migrainosus, not intractable       PLAN:   MIGRAINE TREATMENT PLAN:  MIGRAINE PREVENTION  LIFESTYLE CHANGES -Stop or avoid smoking -Decrease or avoid caffeine / alcohol -Eat and  sleep on a regular schedule -Exercise several times per week  MIGRAINE RESCUE  - ibuprofen, tylenol as needed - rizatriptan (Maxalt) 10mg  as needed for breakthrough headache; may repeat x 1 after 2 hours; max 2 tabs per day or 8 per month - ondansetron 4mg  as needed for nausea  MRI BRAIN FINDINGS - minimal non-specific; could be within normal limits vs migraine associated gliosis; no further testing advised; monitor for now  Meds ordered this encounter  Medications   rizatriptan (MAXALT-MLT) 10 MG disintegrating tablet    Sig: Take 1 tablet (10 mg total) by mouth as needed for migraine. May repeat in 2 hours if needed    Dispense:  9 tablet    Refill:  11   ondansetron (ZOFRAN) 4 MG tablet    Sig: Take 1 tablet (4 mg total) by mouth 2 (two) times daily as needed for nausea or vomiting.    Dispense:  30 tablet    Refill:  3   Return in about 6 months (around 09/27/2021) for MyChart visit (15 min).    , MD 03/29/2021, 9:41 AM Certified in Neurology,  Neurophysiology and Neuroimaging  [facility] [address], [address] [place], [address] [place] [phone]

## 2021-03-29 NOTE — Telephone Encounter (Signed)
Dr. Marjory Lies wants pt to do a MyChart visit, he doesn't do those, so I can't schedule her for next appt.

## 2021-04-01 ENCOUNTER — Telehealth: Payer: Self-pay | Admitting: *Deleted

## 2021-04-01 NOTE — Telephone Encounter (Signed)
Rizatriptan PA signed, faxed to [facility]. Received confirmation.

## 2021-04-01 NOTE — Telephone Encounter (Signed)
Rizatriptan PA for [place] [facility] on [address] [employer] for [employer].

## 2021-04-01 NOTE — Telephone Encounter (Signed)
Called pt and LVM for pt to call back and schedule a f/u Office Visit with MD. Message stated that office is now closed and can call tomorrow morning to schedule.

## 2021-04-01 NOTE — Telephone Encounter (Signed)
Please just schedule pt for OV if she doesn't to MyChart.

## 2021-04-02 NOTE — Telephone Encounter (Signed)
Noted  

## 2021-04-02 NOTE — Telephone Encounter (Addendum)
Called [facility], spoke with Dashia who stated PA denied. Patient only has family planning. It only covers 5 medications a year for either treating STDs or birth control.  Call Ref #[id number]. Called patient and advised her. Recommended she use [facility] and get medication at [employer] or [facility] for lowest prices. Left # for questions.

## 2021-05-27 ENCOUNTER — Ambulatory Visit (INDEPENDENT_AMBULATORY_CARE_PROVIDER_SITE_OTHER): Payer: Self-pay | Admitting: Family Medicine

## 2021-05-27 ENCOUNTER — Encounter: Payer: Self-pay | Admitting: Family Medicine

## 2021-05-27 ENCOUNTER — Other Ambulatory Visit: Payer: Self-pay

## 2021-05-27 VITALS — BP 143/80 | HR 88 | Wt 249.6 lb

## 2021-05-27 DIAGNOSIS — R102 Pelvic and perineal pain: Secondary | ICD-10-CM

## 2021-05-27 NOTE — Progress Notes (Signed)
° ° °  SUBJECTIVE:   CHIEF COMPLAINT / HPI:   Has been having sharp abdominal pain that moves from right to left side and radiate to the back. Pain comes and goes and occurs daily. Has been occurring since last year, July 2022 when she went to the hospital for it and found out she had a cyst on her right ovary.Pain has been the same, not worsened. Endorses a cramping feeling, does hurt when she moves certain ways. Takes ibuprofen for the pain and it will calm it down but then pain returns. Denies burning with urination but does feel she leaks urine. Denies blood in urine. Denies diarrhea or constipation  Does endorse monthly menstural cycles but sometimes late by 8-10 days. Pain feels worsened during her menstrual cycles. Flow is heavy some months not all. On the heavy cycles goes through 4 of the heavy pads. Denies any new partners   PERTINENT  PMH / PSH:  Ovarian cyst, migraines   OBJECTIVE:   BP (!) 143/80    Pulse 88    Wt 249 lb 9.6 oz (113.2 kg)    LMP 05/02/2021    SpO2 99%    BMI 39.09 kg/m    Physical exam General: well appearing, NAD Cardiovascular: RRR, no murmurs Lungs: CTAB. Normal WOB Abdomen: soft, non-distended. Tender to palpation in lower left quadrant, at around the anterior superior iliac spine. Elevated R leg reproduced the pain on the L side, as did elevating the L leg.  Skin: warm, dry. No edema  ASSESSMENT/PLAN:   No problem-specific Assessment & Plan notes found for this encounter.   Lower abdomina/ pelvic pain  Patient has had chronic intermittent lower abdominal pain that moves back and forth from the right and left side that occurs daily for the past 6 months. Denies urinary or vaginal symptoms. She has been seen in the office for this prior. She was initially seen in the ED  Ct scan obtained which showed a right ovarian cyst and no kidney stones. Given the chronicity and type of pain, unlikely to be ovarian abscess or torsion. Potentially endometriosis given it  seems to worsen on her cycles. Will obtain pelvic ultrasound to further evaluate. She would not want to start contraception if this was the case as she is trying to become pregnant. I think It is more likely musculoskeletal given the physical exam and its reproducibility, as well as exacerbation of pain with movement. Discussed talking further about therapy if pelvic US normal such as physical therapy, weight loss, and additional conservative measures.   Cora Collum, DO [facility]

## 2021-05-27 NOTE — Patient Instructions (Signed)
It was great seeing you today!  Im sorry you are still experiencing abdominal/pelvic pain. We are going to get a pelvic ultrasound to check for endometriosis and if that is negative it is likely due to musculoskeletal pain in which exercises/ physical therapy would be helpful.   I will call you with the results and we can go from there!   Feel free to call with any questions or concerns at any time, at [phone].   Take care,  Dr. Cora Collum [facility]

## 2021-06-03 ENCOUNTER — Other Ambulatory Visit: Payer: Self-pay

## 2021-06-03 ENCOUNTER — Ambulatory Visit
Admission: RE | Admit: 2021-06-03 | Discharge: 2021-06-03 | Disposition: A | Discharge status: Home / Self Care | Payer: Medicaid Other | Source: Ambulatory Visit | Attending: Family Medicine | Admitting: Family Medicine

## 2021-06-03 DIAGNOSIS — R102 Pelvic and perineal pain: Secondary | ICD-10-CM | POA: Insufficient documentation

## 2021-06-07 ENCOUNTER — Other Ambulatory Visit: Payer: Self-pay | Admitting: Family Medicine

## 2021-06-07 ENCOUNTER — Telehealth: Payer: Self-pay

## 2021-06-07 DIAGNOSIS — N83202 Unspecified ovarian cyst, left side: Secondary | ICD-10-CM

## 2021-06-07 NOTE — Telephone Encounter (Signed)
Patient calls nurse line requesting to speak with Dr. Idalia Needle regarding results from pelvic US. Please return call to patient at [phone].  Veronda Prude, RN

## 2021-07-29 NOTE — Progress Notes (Signed)
? ? ?Name: Chloe Sullivan  ?MRN/ DOB: [id number], 25-Nov-1990    ?Age/ Sex: 31 y.o., female   ? ?PCP: Cora Collum, DO   ?Reason for Endocrinology Evaluation: Hyperprolactinemia  ?   ?Date of Initial Endocrinology Evaluation: 07/30/2021   ? ? ?HPI: ?Ms. Chloe Sullivan is a 31 y.o. female with a past medical history of migraine headaches. The patient presented for initial endocrinology clinic visit on 07/30/2021 for consultative assistance with her hyperprolactinemia.  ? ?She has been noted with slightly elevated prolactin level during evaluation in 12/2020 at 26.4 NG/mL (reference 4.8-23.3) ?MRI of the brain showed normal pituitary gland 12/2020 ? ?Denies galactorrhea  ?LMP 07/06/2021 ?She has irregular menstruations that was noted last year. She was not offered COC's at the time . She was found to have an ovarian cyst, was given Abx  , she now has menstruations every 31 days  ? ?She continues with right side abdominal pain ? ?She has 67 yr old child  ?She is trying to conceive , has been trying for 3 years , she was referred to infertility specialty but its cost prohibitive  ? ?Has migraine headaches that are fluctuating  ?Has noted visual changes  ?Denies diarrhea but has constipation  ? ?Denies hirsutism  ?Denies acne  ?She is not on any narcotics  ?No cannabis use  ? ? ? ?HISTORY:  ?Past Medical History: No past medical history on file. ?Past Surgical History: No past surgical history on file.  ?Social History:  reports that she has been smoking cigarettes. She has never used smokeless tobacco. She reports current alcohol use. She reports that she does not use drugs. ?Family History: family history includes Arthritis in her mother; Diabetes in her mother; Epilepsy in her brother; High blood pressure in her brother and mother. ? ? ?HOME MEDICATIONS: ?Allergies as of 07/30/2021   ?No Known Allergies ?  ? ?  ?Medication List  ?  ? ?  ? Accurate as of July 30, 2021  9:55 AM. If you have any questions, ask your nurse  or doctor.  ?  ?  ? ?  ? ?aspirin EC 81 MG tablet ?Take 1 tablet (81 mg total) by mouth daily. Swallow whole. ?  ?Magnesium 400 MG Caps ?Take 400 mg by mouth daily as needed. ?  ?ondansetron 4 MG tablet ?Commonly known as: ZOFRAN ?Take 1 tablet (4 mg total) by mouth 2 (two) times daily as needed for nausea or vomiting. ?  ?rizatriptan 10 MG disintegrating tablet ?Commonly known as: MAXALT-MLT ?Take 1 tablet (10 mg total) by mouth as needed for migraine. May repeat in 2 hours if needed ?  ? ?  ?  ? ? ?REVIEW OF SYSTEMS: ?A comprehensive ROS was conducted with the patient and is negative except as per HPI  ? ? ? ?OBJECTIVE:  ?VS: BP 136/70 (BP Location: Left Arm, Patient Position: Sitting, Cuff Size: Large)   Pulse 76   Ht 5\' 7"  (1.702 m)   Wt 252 lb (114.3 kg)   SpO2 99%   BMI 39.47 kg/m?   ? ?Wt Readings from Last 3 Encounters:  ?07/30/21 252 lb (114.3 kg)  ?05/27/21 249 lb 9.6 oz (113.2 kg)  ?03/29/21 247 lb (112 kg)  ? ? ? ?EXAM: ?General: Pt appears well and is in NAD  ?Eyes: External eye exam normal without stare, lid lag or exophthalmos.  EOM intact.  PERRL.  ?Neck: General: Supple without adenopathy. ?Thyroid: Thyroid size normal.  No goiter or nodules  appreciated.   ?Lungs: Clear with good BS bilat with no rales, rhonchi, or wheezes  ?Heart: Auscultation: RRR.  ?Abdomen: Normoactive bowel sounds, soft, nontender, without masses or organomegaly palpable  ?Extremities:  ?BL LE: No pretibial edema normal ROM and strength.  ?Mental Status: Judgment, insight: Intact ?Orientation: Oriented to time, place, and person ?Mood and affect: No depression, anxiety, or agitation  ? ? ? ?DATA REVIEWED: ? Latest Reference Range & Units 07/30/21 10:08  ?Sodium 135 - 145 mEq/L 138  ?Potassium 3.5 - 5.1 mEq/L 5.0  ?Chloride 96 - 112 mEq/L 102  ?CO2 19 - 32 mEq/L 30  ?Glucose 70 - 99 mg/dL 248 (H)  ?BUN 6 - 23 mg/dL 12  ?Creatinine 0.40 - 1.20 mg/dL 2.50  ?Calcium 8.4 - 10.5 mg/dL 9.3  ?GFR >60.00 mL/min 119.31  ? ?  ?  Latest Reference Range & Units 07/30/21 10:08  ?LH mIU/mL 5.83  ?FSH mIU/ML 4.7  ?Prolactin ng/mL 11.7  ?Glucose 70 - 99 mg/dL 037 (H)  ?Estradiol pg/mL 48  ?TSH 0.35 - 5.50 uIU/mL 1.63  ?T4,Free(Direct) 0.60 - 1.60 ng/dL 0.48  ? ? ?Brain MRI 01/21/2021 ? ?Normal pituitary. No pituitary enlargement or micro adenoma ?identified ?  ?Few small deep white matter hyperintensities bilaterally, ?nonspecific. Correlate with history of risk factors for small vessel ?ischemia. Correlate with history of migraine headaches. Pattern not ?typical for demyelinating disease. ? ?ASSESSMENT/PLAN/RECOMMENDATIONS:  ? ?Hyperprolactinemia ? ?- Pituitary MRI normal  ?-Patient is asymptomatic ?-Prolactin is normal, included to me previous cause of elevation could be assay interference ? ? ? ? ?2. Obesity/Infertility  ? ? ?- Pt unable to visit the infertility clinic due to cost  ?- Pt has been trying to conceive for ~ 3 yrs without success  ?- Pt advised to follow a weight loss diet and exercise to improve infertility chances  ?- Pelvic ultrasound showed bilateral ovarian cysts , despite menstruation cycles changing from every 28 days to every 32 days she has been getting them regularly this year.  She has no acne no hirsutism but will check testosterone for possible diagnosis of PCOS ?-FSH, LH, and estradiol are normal as well as TFTs ? ?3.  Abdominal pain: ? ?-She was initially under the impression that she is here for abdominal pain, I did explain to the patient that abdominal pain is beyond the scope of endocrinology and that her referral has been to evaluate elevated prolactin level ?-Patient to follow-up with GYN or PCP for further evaluation of right-sided abdominal pain ? ?Follow-up pending lab results ? ?Signed electronically by: ?Abby Raelyn Mora, MD ? ?[facility]  ?[facility] ?[address] ?[place], [place] [address] ?Phone: [phone] ?FAX: [phone] ? ? ?CC: ?Cora Collum,  DO ?[address] ?[place] [address] ?Phone: [phone] ?Fax: [phone] ? ? ?Return to Endocrinology clinic as below: ?No future appointments. ?  ? ? ? ? ? ?

## 2021-07-30 ENCOUNTER — Encounter: Payer: Self-pay | Admitting: Internal Medicine

## 2021-07-30 ENCOUNTER — Ambulatory Visit (INDEPENDENT_AMBULATORY_CARE_PROVIDER_SITE_OTHER): Payer: Self-pay | Admitting: Internal Medicine

## 2021-07-30 VITALS — BP 136/70 | HR 76 | Ht 67.0 in | Wt 252.0 lb

## 2021-07-30 DIAGNOSIS — R635 Abnormal weight gain: Secondary | ICD-10-CM

## 2021-07-30 DIAGNOSIS — E221 Hyperprolactinemia: Secondary | ICD-10-CM

## 2021-07-30 LAB — BASIC METABOLIC PANEL
BUN: 12 mg/dL (ref 6–23)
CO2: 30 mEq/L (ref 19–32)
Calcium: 9.3 mg/dL (ref 8.4–10.5)
Chloride: 102 mEq/L (ref 96–112)
Creatinine, Ser: 0.59 mg/dL (ref 0.40–1.20)
GFR: 119.31 mL/min (ref >60.00)
Glucose, Bld: 104 mg/dL — ABNORMAL HIGH (ref 70–99)
Potassium: 5 mEq/L (ref 3.5–5.1)
Sodium: 138 mEq/L (ref 135–145)

## 2021-07-30 LAB — LUTEINIZING HORMONE: LH: 5.83 m[IU]/mL

## 2021-07-30 LAB — TSH: TSH: 1.63 u[IU]/mL (ref 0.35–5.50)

## 2021-07-30 LAB — T4, FREE: Free T4: 0.75 ng/dL (ref 0.60–1.60)

## 2021-07-30 LAB — FOLLICLE STIMULATING HORMONE: FSH: 4.7 m[IU]/mL

## 2021-08-02 LAB — TESTOSTERONE, TOTAL, LC/MS/MS: Testosterone, Total, LC-MS-MS: 19 ng/dL (ref 2–45)

## 2021-08-02 LAB — PROLACTIN: Prolactin: 11.7 ng/mL

## 2021-08-02 LAB — ESTRADIOL: Estradiol: 48 pg/mL

## 2021-10-01 ENCOUNTER — Encounter: Payer: Self-pay | Admitting: *Deleted

## 2021-11-25 ENCOUNTER — Ambulatory Visit: Payer: Medicaid Other | Admitting: Family Medicine

## 2022-04-14 ENCOUNTER — Other Ambulatory Visit: Payer: Self-pay

## 2022-04-14 ENCOUNTER — Encounter: Payer: Self-pay | Admitting: Family Medicine

## 2022-04-14 ENCOUNTER — Other Ambulatory Visit (HOSPITAL_COMMUNITY)
Admission: RE | Admit: 2022-04-14 | Discharge: 2022-04-14 | Disposition: A | Discharge status: Home / Self Care | Payer: Medicaid Other | Source: Ambulatory Visit | Attending: Family Medicine | Admitting: Family Medicine

## 2022-04-14 ENCOUNTER — Ambulatory Visit (INDEPENDENT_AMBULATORY_CARE_PROVIDER_SITE_OTHER): Payer: Medicaid Other | Admitting: Student

## 2022-04-14 VITALS — BP 136/89 | HR 88 | Wt 256.0 lb

## 2022-04-14 DIAGNOSIS — Z113 Encounter for screening for infections with a predominantly sexual mode of transmission: Secondary | ICD-10-CM

## 2022-04-14 DIAGNOSIS — Z3189 Encounter for other procreative management: Secondary | ICD-10-CM

## 2022-04-14 DIAGNOSIS — Z23 Encounter for immunization: Secondary | ICD-10-CM | POA: Diagnosis not present

## 2022-04-14 DIAGNOSIS — R7309 Other abnormal glucose: Secondary | ICD-10-CM | POA: Diagnosis not present

## 2022-04-14 DIAGNOSIS — Z0000 Encounter for general adult medical examination without abnormal findings: Secondary | ICD-10-CM

## 2022-04-14 MED ORDER — PRENATAL 28-0.8 MG PO TABS: 1.0000 | ORAL_TABLET | Freq: Every day | ORAL | 2 refills | Status: DC

## 2022-04-14 NOTE — Patient Instructions (Signed)
Ms. Natalin, Bible to meet you! Thanks for rolling with [place] on the schedule change--You should be taking a Prenatal vitamin when trying to get pregnant. We will get a panel of STD tests. If negative I will shoot you a mychart message, if positive I will call you.  Tetanus today  Dorothyann Gibbs, MD
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
## 2022-04-15 LAB — RPR W/REFLEX TO TREPSURE: RPR: REACTIVE — AB

## 2022-04-15 LAB — HIV ANTIBODY (ROUTINE TESTING W REFLEX): HIV Screen 4th Generation wRfx: NONREACTIVE

## 2022-04-15 LAB — RPR, QUANT: RPR, Quant: 1:4 {titer} — ABNORMAL HIGH

## 2022-04-15 LAB — HCV AB W REFLEX TO QUANT PCR: HCV Ab: NONREACTIVE

## 2022-04-15 LAB — HEMOGLOBIN A1C
Est. average glucose Bld gHb Est-mCnc: 114 mg/dL
Hgb A1c MFr Bld: 5.6 % (ref 4.8–5.6)

## 2022-04-15 LAB — HCV INTERPRETATION

## 2022-04-16 LAB — CERVICOVAGINAL ANCILLARY ONLY
Chlamydia: NEGATIVE
Comment: NEGATIVE
Comment: NEGATIVE
Comment: NORMAL
Neisseria Gonorrhea: NEGATIVE
Trichomonas: NEGATIVE

## 2022-04-17 ENCOUNTER — Telehealth: Payer: Self-pay

## 2022-04-17 NOTE — Telephone Encounter (Signed)
Chloe Sullivan with [facility] Department calls nurse line in regards to reactive RPR test.   He reports we need to order a T Pallidum to confirm.   He is unsure if she has a hx of Syphilis. He reports she was previous resident of [place].  Will forward to ordering provider.   I will need to update Chloe Sullivan so please let me know the plan.

## 2022-04-18 ENCOUNTER — Encounter: Payer: Self-pay | Admitting: Student

## 2022-04-18 ENCOUNTER — Other Ambulatory Visit: Payer: Self-pay | Admitting: Student

## 2022-04-18 DIAGNOSIS — Z113 Encounter for screening for infections with a predominantly sexual mode of transmission: Secondary | ICD-10-CM

## 2022-04-18 NOTE — Addendum Note (Signed)
Addended by: Darnelle Spangle B on: 04/18/2022 01:44 PM   Modules accepted: Orders

## 2022-04-23 NOTE — Telephone Encounter (Signed)
Patient contacted and has been scheduled for 3 treatments.   Attempted to call Chloe Sullivan to discuss. However, I had to LVM.

## 2022-04-25 ENCOUNTER — Ambulatory Visit (INDEPENDENT_AMBULATORY_CARE_PROVIDER_SITE_OTHER): Payer: Medicaid Other

## 2022-04-25 DIAGNOSIS — A539 Syphilis, unspecified: Secondary | ICD-10-CM | POA: Diagnosis not present

## 2022-04-25 MED ORDER — PENICILLIN G BENZATHINE 1200000 UNIT/2ML IM SUSY
1.2000 10*6.[IU] | PREFILLED_SYRINGE | Freq: Once | INTRAMUSCULAR | Status: AC
  Administered 2022-04-25: 1.2 10*6.[IU] via INTRAMUSCULAR

## 2022-04-25 NOTE — Progress Notes (Signed)
Patient in nurse clinic today for STD treatment of Syphilis.    Patient advised to abstain from sex for 7-10 days after treatment of self and partner.    1.2 million units of penicillin given in LUOQ and 1.2 million units of penicillin given in RUOQ per [facility]'s orders.  Patient observed 15 minutes in office.  No reaction noted.   Patient to follow up in 2-3 months for re-screening.    Provided condoms and advised to use with all sexual activity. Patient verbalized understanding.   STD report form fax completed and faxed to [facility] Department at [phone] (STD department).

## 2022-05-02 ENCOUNTER — Ambulatory Visit (INDEPENDENT_AMBULATORY_CARE_PROVIDER_SITE_OTHER): Payer: Medicaid Other

## 2022-05-02 DIAGNOSIS — A539 Syphilis, unspecified: Secondary | ICD-10-CM | POA: Diagnosis not present

## 2022-05-02 MED ORDER — PENICILLIN G BENZATHINE 1200000 UNIT/2ML IM SUSY
1.2000 10*6.[IU] | PREFILLED_SYRINGE | Freq: Once | INTRAMUSCULAR | Status: AC
  Administered 2022-05-02: 1.2 10*6.[IU] via INTRAMUSCULAR

## 2022-05-02 NOTE — Progress Notes (Signed)
Patient in nurse clinic today for STD treatment of Syphilis.     Patient advised to abstain from sex for 7-10 days after treatment of self and partner.     1.2 million units of penicillin given in LUOQ and 1.2 million units of penicillin given in RUOQ per [facility] orders.  Patient observed 15 minutes in office.  No reaction noted.    Patient to follow up in 2-3 months for re-screening.     Provided condoms and advised to use with all sexual activity. Patient verbalized understanding.    STD report form fax completed and faxed to [facility] Department at [phone] (STD department).     Final treatment scheduled for 05/09/2022.

## 2022-05-09 ENCOUNTER — Ambulatory Visit (INDEPENDENT_AMBULATORY_CARE_PROVIDER_SITE_OTHER): Payer: Medicaid Other

## 2022-05-09 ENCOUNTER — Ambulatory Visit (INDEPENDENT_AMBULATORY_CARE_PROVIDER_SITE_OTHER): Payer: Medicaid Other | Admitting: Family Medicine

## 2022-05-09 VITALS — BP 110/68 | HR 60 | Ht 67.0 in | Wt 255.0 lb

## 2022-05-09 DIAGNOSIS — M62830 Muscle spasm of back: Secondary | ICD-10-CM | POA: Diagnosis not present

## 2022-05-09 DIAGNOSIS — M5416 Radiculopathy, lumbar region: Secondary | ICD-10-CM | POA: Insufficient documentation

## 2022-05-09 DIAGNOSIS — A539 Syphilis, unspecified: Secondary | ICD-10-CM | POA: Diagnosis not present

## 2022-05-09 DIAGNOSIS — M5431 Sciatica, right side: Secondary | ICD-10-CM

## 2022-05-09 MED ORDER — CYCLOBENZAPRINE HCL 5 MG PO TABS: 5.0000 mg | ORAL_TABLET | Freq: Two times a day (BID) | ORAL | 1 refills | Status: DC | PRN

## 2022-05-09 MED ORDER — PENICILLIN G BENZATHINE 1200000 UNIT/2ML IM SUSY
1.2000 10*6.[IU] | PREFILLED_SYRINGE | Freq: Once | INTRAMUSCULAR | Status: AC
  Administered 2022-05-09: 1.2 10*6.[IU] via INTRAMUSCULAR

## 2022-05-09 NOTE — Assessment & Plan Note (Addendum)
>>  ASSESSMENT AND PLAN FOR SCIATICA OF RIGHT SIDE WRITTEN ON 05/09/2022  9:12 AM BY Chalsea Darko, MD  Positive leg raise on right.  - aleve  BID x3-5 days - stretches provided    >>ASSESSMENT AND PLAN FOR BACK MUSCLE SPASM WRITTEN ON 05/09/2022  9:11 AM BY Othman Masur, MD  Suspect due to lifting at work. No red flags.  - aleve  Bid x3-5 days - limited flexeril  BID PRN - proper lifting technique - core strengthening

## 2022-05-09 NOTE — Progress Notes (Signed)
Patient in nurse clinic today for STD treatment of Syphilis.     Patient advised to abstain from sex for 7-10 days after treatment of self and partner.     1.2 million units of penicillin given in LUOQ and 1.2 million units of penicillin given in RUOQ per [facility] orders.  Patient observed 15 minutes in office. No reaction noted.    Provided condoms and advised to use with all sexual activity. Patient verbalized understanding.    STD report form fax completed and faxed to [facility] Department at [phone] (STD department).     Patient advised retesting in 6 months.

## 2022-05-09 NOTE — Assessment & Plan Note (Signed)
Suspect due to lifting at work. No red flags.  - aleve Bid x3-5 days - limited flexeril BID PRN - proper lifting technique - core strengthening

## 2022-05-09 NOTE — Assessment & Plan Note (Signed)
" >>  ASSESSMENT AND PLAN FOR SCIATICA OF RIGHT SIDE WRITTEN ON 04/10/2024 12:54 PM BY BHAGAT, VIRALI, DO   >>ASSESSMENT AND PLAN FOR SCIATICA OF RIGHT SIDE WRITTEN ON 05/09/2022  9:12 AM BY Jacquelina Hewins, MD  Positive leg raise on right.  - aleve  BID x3-5 days - stretches provided    >>ASSESSMENT AND PLAN FOR BACK MUSCLE SPASM WRITTEN ON 05/09/2022  9:11 AM BY Ben Sanz, MD  Suspect due to lifting at work. No red flags.  - aleve  Bid x3-5 days - limited flexeril  BID PRN - proper lifting technique - core strengthening "

## 2022-05-09 NOTE — Patient Instructions (Signed)
It was wonderful to see you today. Thank you for allowing me to be a part of your care. Below is a short summary of what we discussed at your visit today:  Back pain I believe you are having muscle spasms of the back causing the pain.  Use aleve twice daily for 3-5 days in order to reduce inflammation. After that, it may be used as-needed.  Use the muscle relaxer flexeril sparingly but as often as twice daily if needed for flares of spasm.  Work on Psychologist, counselling and make sure you are not straining your back.  Training your core abdominal muscles will relieve pressure from the back and facilitate proper lifting technique.   Sciatica I believe you have sciatica of the right leg.  The aleve above will help.  Do the stretches provided, this is key for sciatica treatment.    Please bring all of your medications to every appointment!  If you have any questions or concerns, please do not hesitate to contact us via phone or MyChart message.   Ezequiel Essex, MD

## 2022-05-09 NOTE — Progress Notes (Signed)
    SUBJECTIVE:   CHIEF COMPLAINT / HPI:   Back pain Chloe Sullivan is a pleasant 32 yo woman who presents today for back pain.  - 3 mo history - located at right thoracic about T10 and right low back - does have radiating pain down right leg - worse with cold weather - works at warehouse, lifts heavy objects routinely - no red flags such as incontinence, leg weakness, or saddle anesthesia  PERTINENT  PMH / PSH:  Patient Active Problem List   Diagnosis Date Noted   Syphilis 05/09/2022   Back muscle spasm 05/09/2022   Sciatica of right side 05/09/2022   Amenorrhea 01/08/2021   Migraine 11/14/2020   Ovarian cyst 11/14/2020    OBJECTIVE:   BP 110/68   Pulse 60   Ht 5\' 7"  (1.702 m)   Wt 255 lb (115.7 kg)   LMP 05/08/2022   SpO2 99%   BMI 39.94 kg/m    Gen: awake, alert, NAD Neuro: BLE strength 5/5 and equal bilaterally of hip, knee, ankle, and great toes, sensation intact and equal MSK: no TTP of midline spine or paraspinal muscles Special: straight leg raise positive on right, negative on left  ASSESSMENT/PLAN:   Back muscle spasm Suspect due to lifting at work. No red flags.  - aleve Bid x3-5 days - limited flexeril BID PRN - proper lifting technique - core strengthening  Sciatica of right side Positive leg raise on right.  - aleve BID x3-5 days - stretches provided     Ezequiel Essex, MD [place]

## 2022-05-10 LAB — RPR, QUANT+TP ABS (REFLEX)
Rapid Plasma Reagin, Quant: 1:2 {titer} — ABNORMAL HIGH
T Pallidum Abs: REACTIVE — AB

## 2022-05-10 LAB — SPECIMEN STATUS REPORT

## 2022-05-10 LAB — RPR: RPR Ser Ql: REACTIVE — AB

## 2022-05-16 NOTE — Progress Notes (Signed)
I was preceptor for this office visit.  

## 2022-05-19 NOTE — Progress Notes (Signed)
I was preceptor for this office visit.  

## 2022-06-16 ENCOUNTER — Ambulatory Visit: Payer: Medicaid Other | Admitting: Family Medicine

## 2022-07-15 DIAGNOSIS — H18622 Keratoconus, unstable, left eye: Secondary | ICD-10-CM | POA: Diagnosis not present

## 2022-09-09 IMAGING — CT CT RENAL STONE PROTOCOL
2 of 4 series · 16 of 46 positions shown, 18 images · non-contrast
Comparison: None.

CLINICAL DATA: Flank pain, renal stone suspected.

EXAM:
CT ABDOMEN AND PELVIS WITHOUT CONTRAST
TECHNIQUE: Multidetector CT imaging of the abdomen and pelvis was performed
following the standard protocol without IV contrast.

[Series 3: ap without · axial · non-contrast · 0.93mm/px · z∈[+863,+1313]mm · 13 of 104 slices shown, 15 images]
[im 7/104  soft-tissue]
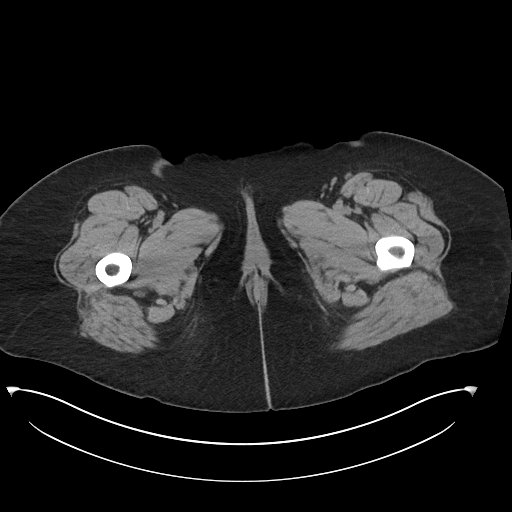
[im 7/104  bone]
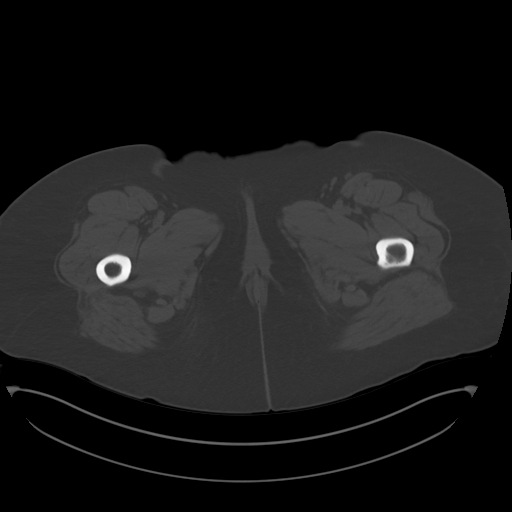
[im 13/104  soft-tissue]
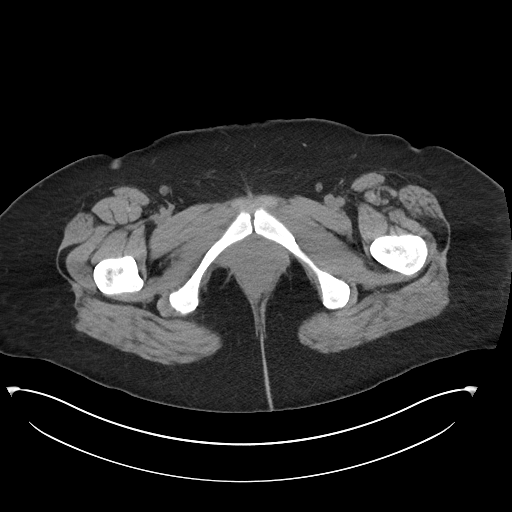
[im 25/104  soft-tissue]
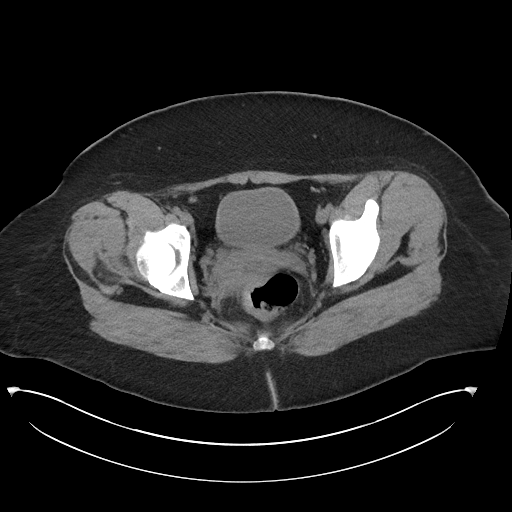
[im 31/104  soft-tissue]
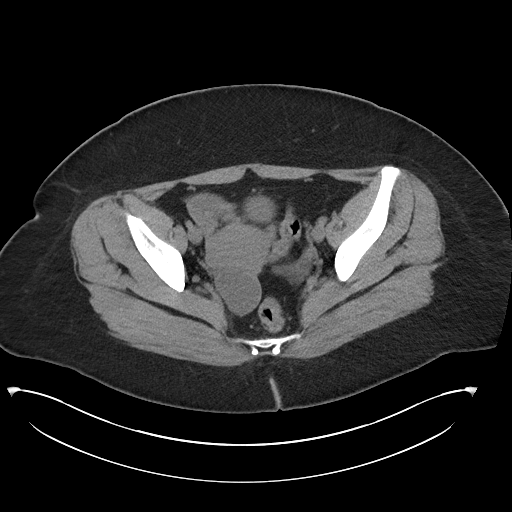
[im 37/104  soft-tissue]
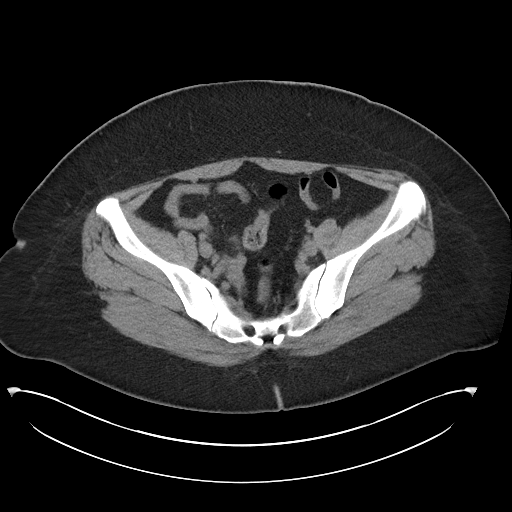
[im 43/104  soft-tissue]
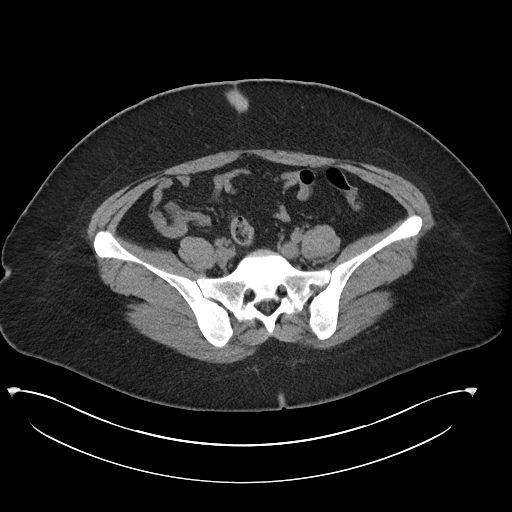
[im 55/104  soft-tissue]
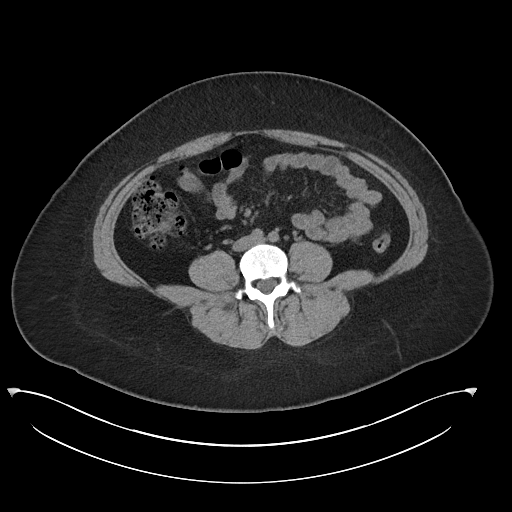
[im 61/104  soft-tissue]
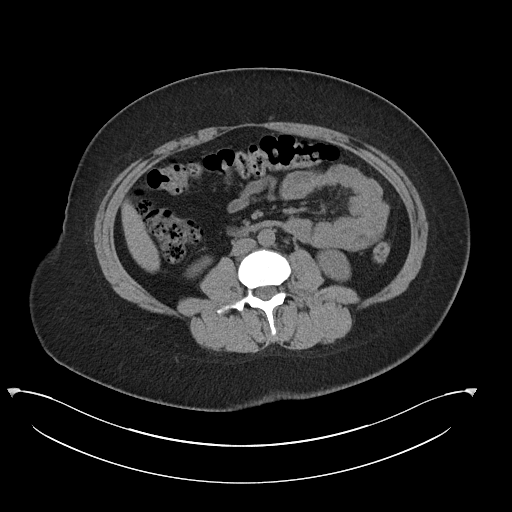
[im 67/104  soft-tissue]
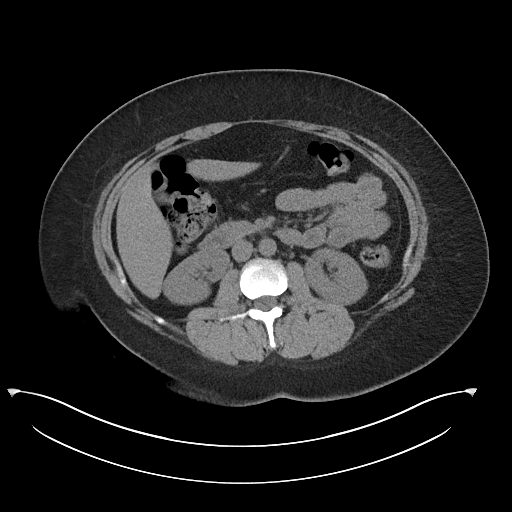
[im 67/104  bone]
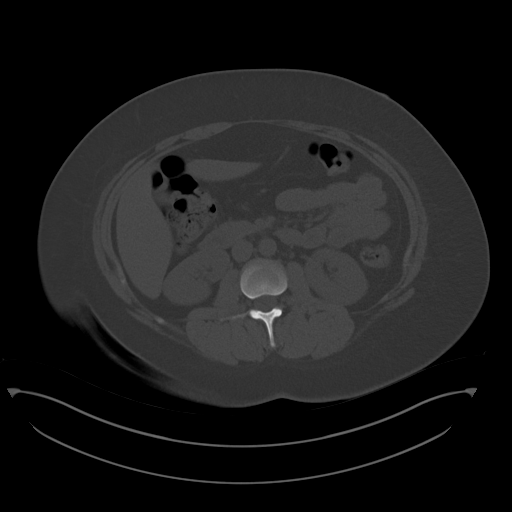
[im 73/104  soft-tissue]
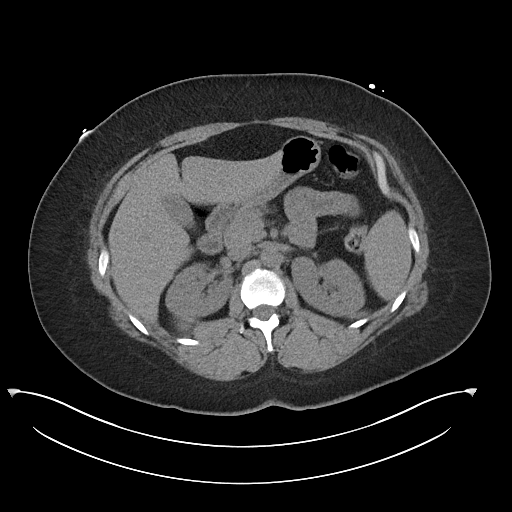
[im 79/104  soft-tissue]
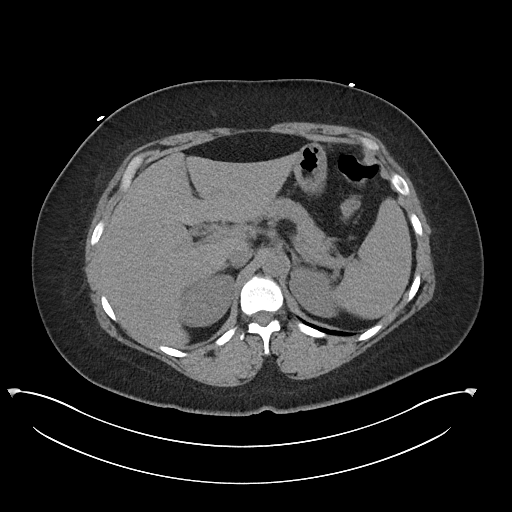
[im 91/104  soft-tissue]
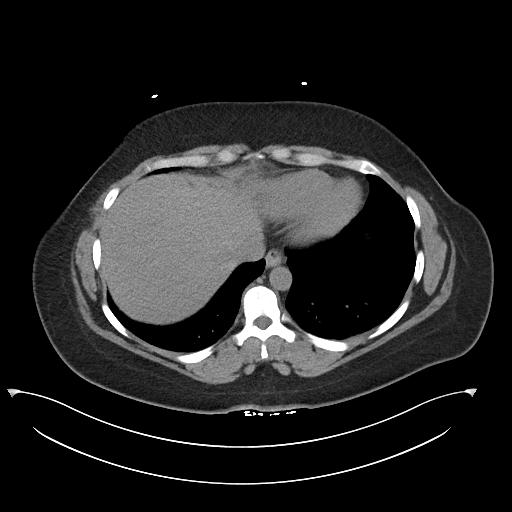
[im 97/104  soft-tissue]
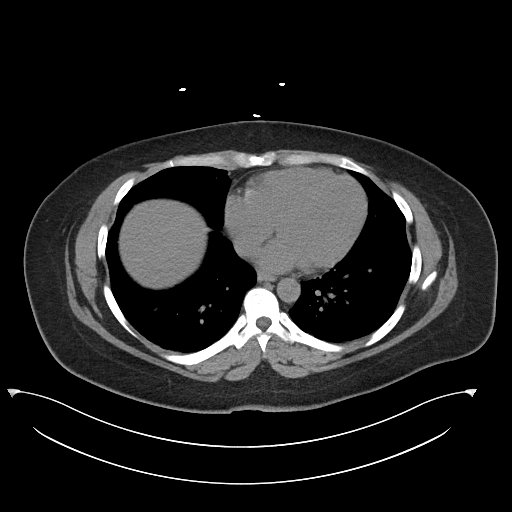

[Series 6: cor · coronal · 0.93mm/px · 3 of 108 slices shown]
[im 36/108  soft-tissue]
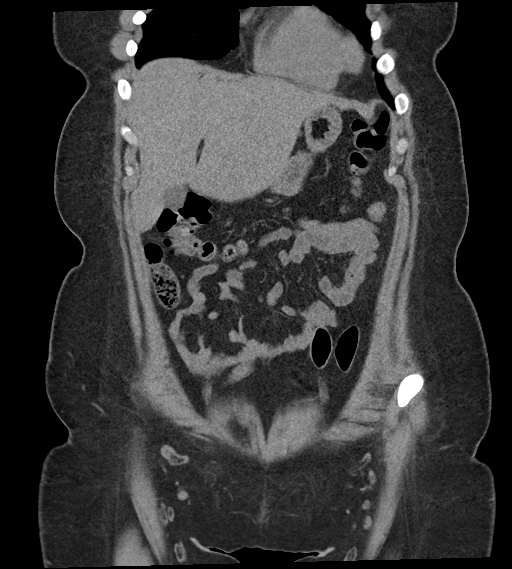
[im 48/108  soft-tissue]
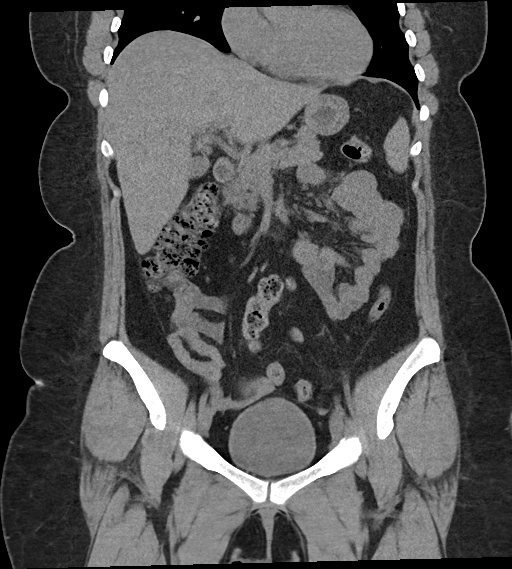
[im 60/108  soft-tissue]
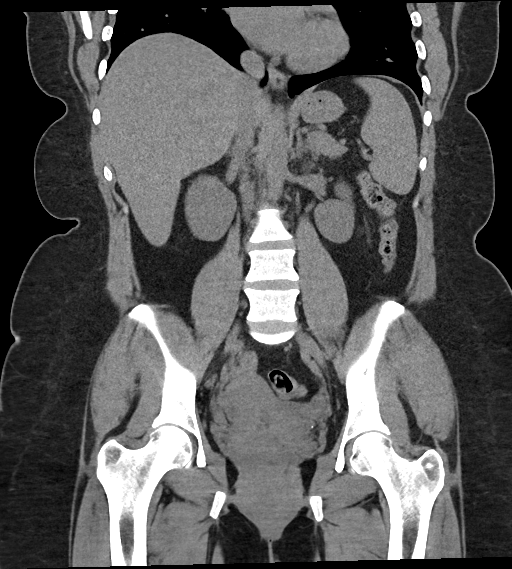

[16 of 46 positions shown; findings below may reference images not displayed]

FINDINGS: Lower chest: Hypoventilatory change in the dependent lungs.

Hepatobiliary: Unremarkable noncontrast appearance of the hepatic
parenchyma. Gallbladder is unremarkable. No biliary ductal dilation.

Pancreas: Within normal limits.

Spleen: Within normal.

Adrenals/Urinary Tract: Adrenal glands are unremarkable. Kidneys are
normal, without renal calculi, contour deforming lesion, or
hydronephrosis. Bladder is unremarkable for degree of distension.

Stomach/Bowel: Stomach is within normal limits. Appendix appears
normal. No evidence of bowel wall thickening, distention, or
inflammatory changes.

Vascular/Lymphatic: No significant vascular findings are present. No
enlarged abdominal or pelvic lymph nodes.

Reproductive: Uterus is unremarkable. Multiple small cystic areas in
the cervix measuring to 1.2 cm likely representing nabothian cysts.
Left ovary is unremarkable. Simple appearing 4.3 cm right ovarian
cyst. No follow-up imaging recommended. Note: This recommendation
does not apply to premenarchal patients and to those with increased
risk (genetic, family history, elevated tumor markers or other
high-risk factors) of ovarian cancer. Reference: JACR [DATE]):[id number]

Other: No abdominopelvic ascites.

Musculoskeletal: No acute or significant osseous findings.
IMPRESSION: 1. No acute abdominopelvic findings. Specifically, no evidence of
nephrolithiasis or obstructive uropathy.
2. Simple appearing 4.3 cm right ovarian cyst. No follow-up imaging
recommended. Note: This recommendation does not apply to
premenarchal patients and to those with increased risk (genetic,
family history, elevated tumor markers or other high-risk factors)
of ovarian cancer. Reference: JACR [DATE]):[id number]

## 2022-09-10 ENCOUNTER — Other Ambulatory Visit (HOSPITAL_COMMUNITY)
Admission: RE | Admit: 2022-09-10 | Discharge: 2022-09-10 | Disposition: A | Discharge status: Home / Self Care | Payer: Medicaid Other | Source: Ambulatory Visit | Attending: Family Medicine | Admitting: Family Medicine

## 2022-09-10 ENCOUNTER — Other Ambulatory Visit: Payer: Self-pay

## 2022-09-10 ENCOUNTER — Ambulatory Visit: Payer: Medicaid Other | Admitting: Family Medicine

## 2022-09-10 VITALS — BP 125/67 | HR 65 | Ht 66.0 in | Wt 257.8 lb

## 2022-09-10 DIAGNOSIS — Z113 Encounter for screening for infections with a predominantly sexual mode of transmission: Secondary | ICD-10-CM | POA: Diagnosis not present

## 2022-09-10 DIAGNOSIS — N898 Other specified noninflammatory disorders of vagina: Secondary | ICD-10-CM | POA: Diagnosis not present

## 2022-09-10 DIAGNOSIS — R102 Pelvic and perineal pain: Secondary | ICD-10-CM | POA: Diagnosis not present

## 2022-09-10 DIAGNOSIS — Z319 Encounter for procreative management, unspecified: Secondary | ICD-10-CM | POA: Diagnosis not present

## 2022-09-10 DIAGNOSIS — Z3169 Encounter for other general counseling and advice on procreation: Secondary | ICD-10-CM | POA: Diagnosis not present

## 2022-09-10 DIAGNOSIS — B9689 Other specified bacterial agents as the cause of diseases classified elsewhere: Secondary | ICD-10-CM | POA: Diagnosis not present

## 2022-09-10 DIAGNOSIS — N760 Acute vaginitis: Secondary | ICD-10-CM | POA: Diagnosis not present

## 2022-09-10 DIAGNOSIS — R1021 Pelvic and perineal pain right side: Secondary | ICD-10-CM

## 2022-09-10 LAB — POCT WET PREP (WET MOUNT)
Clue Cells Wet Prep Whiff POC: POSITIVE
Trichomonas Wet Prep HPF POC: ABSENT

## 2022-09-10 MED ORDER — METRONIDAZOLE 500 MG PO TABS
500.0000 mg | ORAL_TABLET | Freq: Two times a day (BID) | ORAL | 0 refills | Status: DC
Start: 2022-09-10 — End: 2022-11-12

## 2022-09-10 NOTE — Patient Instructions (Addendum)
It was wonderful to see you today.  Please bring ALL of your medications with you to every visit.   Today we talked about:  Will schedule your pelvic ultrasound to evaluate the cause of your pain.  I will let you know the results and any next steps. In the meantime you can take Tylenol and use heating pads for your pain.  I sent a referral to [place] for infertility counseling and evaluation. I recommend you take prenatal vitamins and avoid ibuprofen/advil which is not safe in pregnancy.  If your pain becomes severe, you have nausea/vomiting, I would recommend going to the emergency department.  -We are checking for sexually transmitted infections including chlamydia, gonorrhea, trichomonas, HIV, syphilis and hepatitis B. I will let you know of the results via MyChart or telephone call. We are also checking for bacterial vaginosis and yeast, which are not sexually transmitted infections.  -You should abstain from sexual activity until we have the results. If your test is positive for a sexually transmitted infection, it is important that both you and your partner are both treated.  -It is always important to use barrier protection, such as condoms, to help prevent sexually transmitted infections.    Please return next month for repeat syphilis testing.   Thank you for coming to your visit as scheduled. We have had a large "no-show" problem lately, and this significantly limits our ability to see and care for patients. As a friendly reminder- if you cannot make your appointment please call to cancel. We do have a no show policy for those who do not cancel within 24 hours. Our policy is that if you miss or fail to cancel an appointment within 24 hours, 3 times in a 2-month period, you may be dismissed from our clinic.   Thank you for choosing [facility].   Please call [phone] with any questions about today's appointment.  Please be sure to schedule  follow up at the front  desk before you leave today.   Sabino Dick, DO PGY-3 Family Medicine

## 2022-09-10 NOTE — Progress Notes (Signed)
SUBJECTIVE:   CHIEF COMPLAINT / HPI:   Chloe Sullivan is a 32 y.o. female who presents to the [facility] clinic today to discuss the following concerns:   Abdominal Pain Started about 1 week ago. Feels like a sharp pain. It is not daily. It waxes and wanes throughout the day when she does have it. The worst it gets is an 8. Right now it is a 5. No N/V. Has not taken any medications today. No hx of abdominal surgeries.   Sometimes has abnormal vaginal discharge that is white.   She has been alternating Advil/Ibuprofen which calms it down but then it comes back.   No dysuria or hematuria.  Sexually active with one partner, not using anything for contraception  May 7th was her LMP. She typically has irregular periods.   5-6 years ago she had an ectopic pregnancy. She did not require any surgery.   Per chart review had a pelvic ultrasound on 05/2021 that showed 3.5 cm simple appearing right ovarian cyst.  She was also noted to have "questionable segment of hydrosalpinx versus less likely exophytic complicated ovarian cyst at left adnexa", recommended better visualization by MR imaging with and without contrast for follow-up ultrasound in 12 weeks.  Does not appear that she had either.  Last Pap 11/2020 was normal, negative for hrHPV.   PERTINENT  PMH / PSH: Ovarian cyst, syphilis (dx 04/14/22, s/p treatment)   OBJECTIVE:   BP 125/67   Pulse 65   Ht 5\' 6"  (1.676 m)   Wt 257 lb 12.8 oz (116.9 kg)   LMP 09/02/2022   SpO2 100%   BMI 41.61 kg/m    General: NAD, pleasant, able to participate in exam Cardiac: RRR, no murmurs. Respiratory: CTAB, normal effort, No wheezes, rales or rhonchi Abdomen: Bowel sounds present, +ttp right adnexa without R/G GU: Normal appearance of labia majora and minora, without lesions. Vagina tissue pink, moist, without lesions or abrasions. Scant white discharge seen at vaginal vault. Cervix normal appearance, non-friable, with scant white discharge from os.   Psych: Normal affect and mood  GU exam chaperoned by Penni Bombard, CMA   ASSESSMENT/PLAN:   1. Patient desires pregnancy Has been trying for the last 6 years unsuccessfully.  She has stopped taking prenatal vitamins.  Reports financial barriers as her insurance did not cover previous infertility counseling and her husband does not have any health insurance.  She has had a baby before but her husband has never had a baby.  Her ectopic pregnancy was also with another partner. - Ambulatory referral to Obstetrics / Gynecology to Dr. April Manson  -Encouraged her to start prenatal vitamins - Encouraged her to track her periods and to buy over-the-counter ovulation kits  2. Infertility counseling See above.  - Ambulatory referral to Obstetrics / Gynecology  3. Vaginal discharge Routine STI testing today.  She does have a history of syphilis and has completed treatment for this.  She is due for retest next month but will check today as well if she wanted full STI check. Wet prep + for BV, flagyl sent.  - Cervicovaginal ancillary only - POCT Wet Prep [employer])  4. Adnexal tenderness, right Localized pain to right adnexa without rebound guarding, nausea, vomiting.  Waxes and wanes.  Low concern for ovarian torsion at this time.  Could be due to simple versus complicated cyst, versus ruptured ovarian cyst.  She did have history of right ovarian cyst on her last pelvic ultrasound in 2022.  Will repeat ultrasound to further classify.  In the meantime, can take Tylenol or use heating pads as needed for pain, would advise against NSAIDs as she is trying to get pregnant. - US Pelvic Complete With Transvaginal; Future - Tylenol, heating pad PRN   5. Routine screening for STI (sexually transmitted infection) +BV, rx for flagyl sent. Will await other lab tests and treat as indicated - Cervicovaginal ancillary only - HIV antibody (with reflex) - RPR w/reflex to TrepSure - Hepatitis B surface  antigen -Also due for repeat RPR next month, patient is aware    Sabino Dick, DO [facility]

## 2022-09-11 LAB — RPR W/REFLEX TO TREPSURE

## 2022-09-11 LAB — CERVICOVAGINAL ANCILLARY ONLY
Chlamydia: NEGATIVE
Comment: NEGATIVE
Comment: NORMAL
Neisseria Gonorrhea: NEGATIVE

## 2022-09-12 LAB — HEPATITIS B SURFACE ANTIGEN: Hepatitis B Surface Ag: NEGATIVE

## 2022-09-12 LAB — HIV ANTIBODY (ROUTINE TESTING W REFLEX): HIV Screen 4th Generation wRfx: NONREACTIVE

## 2022-09-12 LAB — RPR, QUANT: RPR, Quant: 1:4 {titer} — ABNORMAL HIGH

## 2022-09-16 ENCOUNTER — Ambulatory Visit (HOSPITAL_COMMUNITY)
Admission: RE | Admit: 2022-09-16 | Discharge: 2022-09-16 | Disposition: A | Discharge status: Home / Self Care | Payer: Medicaid Other | Source: Ambulatory Visit | Attending: Family Medicine | Admitting: Family Medicine

## 2022-09-16 DIAGNOSIS — R102 Pelvic and perineal pain: Secondary | ICD-10-CM | POA: Diagnosis not present

## 2022-09-16 DIAGNOSIS — H5213 Myopia, bilateral: Secondary | ICD-10-CM | POA: Diagnosis not present

## 2022-09-16 DIAGNOSIS — N83291 Other ovarian cyst, right side: Secondary | ICD-10-CM | POA: Diagnosis not present

## 2022-09-18 ENCOUNTER — Telehealth: Payer: Self-pay

## 2022-09-18 NOTE — Telephone Encounter (Signed)
Patient calls nurse line regarding pelvic ultrasound results.   Advised that we are still awaiting results and provider will contact her once these have been received.   Veronda Prude, RN

## 2022-10-01 ENCOUNTER — Ambulatory Visit: Payer: Medicaid Other | Admitting: Family Medicine

## 2022-10-01 ENCOUNTER — Encounter: Payer: Self-pay | Admitting: Family Medicine

## 2022-10-01 ENCOUNTER — Other Ambulatory Visit: Payer: Self-pay

## 2022-10-01 VITALS — BP 113/63 | HR 72 | Ht 66.0 in | Wt 257.2 lb

## 2022-10-01 DIAGNOSIS — N83201 Unspecified ovarian cyst, right side: Secondary | ICD-10-CM | POA: Diagnosis not present

## 2022-10-01 DIAGNOSIS — A539 Syphilis, unspecified: Secondary | ICD-10-CM

## 2022-10-01 DIAGNOSIS — M5431 Sciatica, right side: Secondary | ICD-10-CM | POA: Diagnosis not present

## 2022-10-01 DIAGNOSIS — M62830 Muscle spasm of back: Secondary | ICD-10-CM | POA: Diagnosis not present

## 2022-10-01 MED ORDER — MELOXICAM 15 MG PO TABS: 15.0000 mg | ORAL_TABLET | Freq: Every day | ORAL | 0 refills | Status: AC

## 2022-10-01 MED ORDER — CYCLOBENZAPRINE HCL 10 MG PO TABS: 10.0000 mg | ORAL_TABLET | Freq: Every day | ORAL | 0 refills | Status: AC

## 2022-10-01 MED ORDER — MEDI-PATCH-LIDOCAINE 0.5-0.035-5-20 % EX PTCH: MEDICATED_PATCH | CUTANEOUS | 0 refills | Status: DC

## 2022-10-01 NOTE — Patient Instructions (Signed)
It was great seeing you today!  For your back pain I have refilled your Flexeril to take at night as needed, and sent in Meloxicam to take once a day for your pain. Continue your stretches daily. I have referred you to sports medicine for further treatment and evaluation. They will call you to schedule that appointment.  I will try to send your gyn referral to another specialist that will not charge as much. You should get a call about that in the next couple of weeks to schedule that appointment   Today you will also get blood work. I will send a mychart message with results.   Visit Reminders: - Stop by the pharmacy to pick up your prescriptions  - Continue to work on your healthy eating habits and incorporating exercise into your daily life.   Feel free to call with any questions or concerns at any time, at [phone].   Take care,  Dr. Cora Collum [facility]

## 2022-10-01 NOTE — Progress Notes (Signed)
    SUBJECTIVE:   CHIEF COMPLAINT / HPI:   Patient presents for syphilis treatment follow up/ retesting.   Completed treatment and was seen last month and had 1:4 titer   Additionally continues to have back pain that has been going on for about 3 months. Did have a fall about 3 years ago at work. Has radiation of pain down her back to her right leg about 2-3 times a week. Takes advil prn and does stretches. Open to referral for other treatment options  Was referred to Fertility specialist as she is trying to get pregnant but states it is too expensive. Has also had pelvic pain that she has wanted to see a specialist for. Will try sending her to Obgyn.   PERTINENT  PMH / PSH: Reviewed   OBJECTIVE:   BP 113/63   Pulse 72   Ht 5\' 6"  (1.676 m)   Wt 257 lb 3.2 oz (116.7 kg)   LMP 09/02/2022   SpO2 98%   BMI 41.51 kg/m    Physical exam General: well appearing, NAD Cardiovascular: RRR, no murmurs Lungs: CTAB. Normal WOB Abdomen: soft, non-distended, non-tender Skin: warm, dry. No edema MSK Lumbar spine: - Inspection: no gross deformity or asymmetry, swelling or ecchymosis - Palpation: No TTP over the spinous processes, does have TTP over lumbar paraspinal muscles - ROM: full active ROM of the lumbar spine in flexion and extension without pain - Strength: 5/5 strength of lower extremity in L4-S1 nerve root distributions b/l; normal gait - Neuro: sensation intact  - Special testing: Negative straight leg raise  ASSESSMENT/PLAN:   Back muscle spasm Patient with continued low back pain with sciatica. No red flag symptoms. Sent in Meloxicam and lidocaine patch, refilled Flexeril. Discussed continuing exercises/stretches. Referred to PMR for further evaluation and treatment   Syphilis Previously received treatment. RPR last month with 1:4 titer. Presents today for repeat testing.      Cora Collum, DO [facility]

## 2022-10-02 NOTE — Assessment & Plan Note (Signed)
>>  ASSESSMENT AND PLAN FOR BACK MUSCLE SPASM WRITTEN ON 10/02/2022  5:09 PM BY Ahmar Pickrell J, DO  Patient with continued low back pain with sciatica. No red flag symptoms. Sent in Meloxicam  and lidocaine  patch, refilled Flexeril . Discussed continuing exercises/stretches. Referred to PMR for further evaluation and treatment

## 2022-10-02 NOTE — Assessment & Plan Note (Signed)
Previously received treatment. RPR last month with 1:4 titer. Presents today for repeat testing.

## 2022-10-02 NOTE — Assessment & Plan Note (Signed)
" >>  ASSESSMENT AND PLAN FOR SCIATICA OF RIGHT SIDE WRITTEN ON 04/10/2024 12:54 PM BY BHAGAT, VIRALI, DO   >>ASSESSMENT AND PLAN FOR BACK MUSCLE SPASM WRITTEN ON 10/02/2022  5:09 PM BY Clarisse Rodriges J, DO  Patient with continued low back pain with sciatica. No red flag symptoms. Sent in Meloxicam  and lidocaine  patch, refilled Flexeril . Discussed continuing exercises/stretches. Referred to PMR for further evaluation and treatment  "

## 2022-10-02 NOTE — Assessment & Plan Note (Addendum)
Patient with continued low back pain with sciatica. No red flag symptoms. Sent in Meloxicam and lidocaine patch, refilled Flexeril. Discussed continuing exercises/stretches. Referred to PMR for further evaluation and treatment

## 2022-10-03 LAB — RPR, QUANT: RPR, Quant: 1:2 {titer} — ABNORMAL HIGH

## 2022-10-03 LAB — RPR W/REFLEX TO TREPSURE: RPR: REACTIVE — AB

## 2022-10-06 ENCOUNTER — Encounter: Payer: Self-pay | Admitting: Physical Medicine and Rehabilitation

## 2022-10-22 DIAGNOSIS — H5213 Myopia, bilateral: Secondary | ICD-10-CM | POA: Diagnosis not present

## 2022-11-03 ENCOUNTER — Encounter: Payer: Medicaid Other | Admitting: Physical Medicine and Rehabilitation

## 2022-11-12 ENCOUNTER — Ambulatory Visit (INDEPENDENT_AMBULATORY_CARE_PROVIDER_SITE_OTHER): Payer: Medicaid Other | Admitting: Obstetrics and Gynecology

## 2022-11-12 ENCOUNTER — Other Ambulatory Visit: Payer: Self-pay

## 2022-11-12 ENCOUNTER — Encounter: Payer: Self-pay | Admitting: Obstetrics and Gynecology

## 2022-11-12 VITALS — BP 121/68 | HR 78 | Ht 66.0 in | Wt 249.0 lb

## 2022-11-12 DIAGNOSIS — R102 Pelvic and perineal pain: Secondary | ICD-10-CM | POA: Diagnosis not present

## 2022-11-12 DIAGNOSIS — G8929 Other chronic pain: Secondary | ICD-10-CM

## 2022-11-12 DIAGNOSIS — N979 Female infertility, unspecified: Secondary | ICD-10-CM | POA: Diagnosis not present

## 2022-11-12 MED ORDER — CYCLOBENZAPRINE HCL 10 MG PO TABS
10.0000 mg | ORAL_TABLET | Freq: Two times a day (BID) | ORAL | 2 refills | Status: DC | PRN
Start: 2022-11-12 — End: 2023-04-06

## 2022-11-12 NOTE — Progress Notes (Signed)
NEW GYNECOLOGY PATIENT Patient name: Chloe Sullivan MRN [id number]  Date of birth: April 29, 1990 Chief Complaint:   No chief complaint on file.     History:  Chloe Sullivan is a 32 y.o. G2P1001 being seen today for pelvic pain and ovarian cyst. .   Will occur where she si in her her cycles and has been ogin gon for years. Will come and go throghout the day. Ibuprofen/advil - will get better. Will typically have it on the right side occasionally on the right side; sharp pain that will occur randomly. Does not matter where in her cycle and does not get worse witth the menses and does not occur with voiding, BM or intercourse. No prior abdominal surgeries  Trying to concieve  x6 years Different partner fathered current child Has not gotten semen analysis and does not have children  They have an appointment next month with infertility spcialist STI in the past without history of pelvic inflammatory  No constipation or diarrhea, no changes in voiding habits Has migraines (since childhood); no other chronic pain syndromes No history of constipation       Gynecologic History Patient's last menstrual period was 10/12/2022 (exact date). Contraception: none Last Pap: 11/2020. NIL, HPV negative Last Mammogram: n/a Last Colonoscopy: n/a  Obstetric History OB History  Gravida Para Term Preterm AB Living  2 1 1    0 1  SAB IAB Ectopic Multiple Live Births      0        # Outcome Date GA Lbr Len/2nd Weight Sex Type Anes PTL Lv  2 Gravida 2019          1 Term 2014     Vag-Spont       Past Medical History:  Diagnosis Date   Medical history non-contributory     Past Surgical History:  Procedure Laterality Date   NO PAST SURGERIES      Current Outpatient Medications on File Prior to Visit  Medication Sig Dispense Refill   Lido-Capsaicin-Men-Methyl Sal (MEDI-PATCH-LIDOCAINE) 0.5-0.035-5-20 % PTCH Apply patch to lower back as needed (Patient not taking: Reported on 11/12/2022) 30  patch 0   Magnesium 400 MG CAPS Take 400 mg by mouth daily as needed. (Patient not taking: Reported on 07/30/2021) 20 capsule 0   Prenatal 28-0.8 MG TABS Take 1 tablet by mouth daily. (Patient not taking: Reported on 09/10/2022) 100 tablet 2   No current facility-administered medications on file prior to visit.    No Known Allergies  Social History:  reports that she has been smoking cigarettes. She has never used smokeless tobacco. She reports current alcohol use. She reports that she does not use drugs.  Family History  Problem Relation Age of Onset   Diabetes Mother    Arthritis Mother    High blood pressure Mother    High blood pressure Brother    Epilepsy Brother     The following portions of the patient's history were reviewed and updated as appropriate: allergies, current medications, past family history, past medical history, past social history, past surgical history and problem list.  Review of Systems Pertinent items noted in HPI and remainder of comprehensive ROS otherwise negative.  Physical Exam:  BP 121/68   Pulse 78   Ht 5\' 6"  (1.676 m)   Wt 249 lb (112.9 kg)   LMP 10/12/2022 (Exact Date)   BMI 40.19 kg/m  Physical Exam Vitals and nursing note reviewed. Exam conducted with a chaperone present.  Constitutional:  Appearance: Normal appearance.  Cardiovascular:     Rate and Rhythm: Normal rate.  Pulmonary:     Effort: Pulmonary effort is normal.     Breath sounds: Normal breath sounds.  Abdominal:     Tenderness: There is abdominal tenderness.     Comments: - carnett in left side of abdomen Equivocal carnett in right side of abdomen Inner right ASIS tenderness   Genitourinary:    General: Normal vulva.     Comments: Normal appearing vulva Normal vulvar sensation bilaterally TEnder superficial pelvic floor muscles Nontender ischial tuberosities bilaterally  Allodynia at introitus: No  Anal wink not present Right levator ani 1/10 Right  ischiococcygeous 3/10 Right obturator internus 6/10 - similar to pain she's been experiecing Left levator ani 1/10 Left ischioccocygeous 1/10 Left obturator internus 1/10 Anterior vaginal wall nontender    Neurological:     General: No focal deficit present.     Mental Status: She is alert and oriented to person, place, and time.  Psychiatric:        Mood and Affect: Mood normal.        Behavior: Behavior normal.        Thought Content: Thought content normal.        Judgment: Judgment normal.    US Pelvic Complete With Transvaginal CLINICAL DATA:  Right adnexal pain, history of ovarian cyst.  EXAM: TRANSABDOMINAL AND TRANSVAGINAL ULTRASOUND OF PELVIS  TECHNIQUE: Both transabdominal and transvaginal ultrasound examinations of the pelvis were performed. Transabdominal technique was performed for global imaging of the pelvis including uterus, ovaries, adnexal regions, and pelvic cul-de-sac. It was necessary to proceed with endovaginal exam following the transabdominal exam to visualize the adnexa.  COMPARISON:  Pelvic ultrasound dated 06/03/2021  FINDINGS: Uterus  Measurements: 7.8 x 4.0 x 5.1 cm = volume: 84 mL. No fibroids or other mass visualized.  Endometrium  Thickness: 5 mm.  No focal abnormality visualized.  Right ovary  Measurements: 4.4 x 2.4 x 3.0 cm = volume: 16 mL. A right ovarian cyst has decreased in size, now measuring 1.8 x 0.9 cm. This is benign and no imaging follow-up is recommended.  Left ovary  Measurements: 2.2 x 1.1 x 2.1 cm = volume: 3 mL. A left adnexal cyst with an irregular contour of the cyst wall measures 2.1 x 1.4 x 2.0 cm, decreased in size since 06/03/2021.  Other findings  No abnormal free fluid.  IMPRESSION: 1. Decrease in size of a left adnexal cyst since 06/03/2021, consistent with a non neoplastic cyst. 2. Benign right ovarian cyst.  Electronically Signed   By: Romona Curls M.D.   On: 09/18/2022 21:36        Assessment and Plan:   1. Infertility, female Has appointment with infertility scheduled - AMH today to assess ovarian reserve. Noted that up to 1/3 of cases have no known cause of infertility found.  - Anti mullerian hormone  2. Chronic pelvic pain in female Pelvic myalgia as well as internal abdominal pain. PFPT referral to address myalgia. Prn muscle relaxer for pain - noted that drowsiness can occur with this medication. No prior abdominal surgeries or other intraabdominal symptoms.    Routine preventative health maintenance measures emphasized. Please refer to After Visit Summary for other counseling recommendations.   Follow-up: No follow-ups on file.      Lorriane Shire, MD Obstetrician & Gynecologist, Faculty Practice [facility] for [employer], [facility]

## 2022-11-12 NOTE — Progress Notes (Signed)
Patient referred due to her having pain on right side of pelvic. She describes the pain as "sharp" and gets the pain randomly once a week. The pain would "come and go" throughout the day.   Emminger informed me that she takes ibuprofen for pain management and it works.    Pelvic US was done back in May. She stated no one spoke to her about the results

## 2022-11-18 LAB — ANTI MULLERIAN HORMONE: ANTI-MULLERIAN HORMONE (AMH): 1.1 ng/mL

## 2022-11-19 ENCOUNTER — Telehealth: Payer: Self-pay | Admitting: General Practice

## 2022-11-19 NOTE — Telephone Encounter (Signed)
Called patient, no answer- left message to call us back for results.  

## 2022-11-19 NOTE — Telephone Encounter (Signed)
Called patient and reviewed results with her. Patient verbalized understanding and states she doesn't have an appt with infertility next month, she was confused. Patient states she declined that referral due to cost. Discussed recommendation of semen analysis as it provides valuable information and is low cost compared to other infertility testing. Patient verbalized understanding and would like referral placed. Told patient I would work on submitting that tomorrow. Patient verbalized understanding.

## 2022-11-19 NOTE — Telephone Encounter (Signed)
-----   Message from Jerene Bears sent at 11/19/2022  6:57 AM EDT ----- Please let pt know her AMH is 1.1.  This is in the normal range but given her age it is a little low.  She and partner have REI consult scheduled for next month.  Partner needs semen analysis but they can plan after that appt.  No other recommendations at this time.  Leda Quail, MD, covering for Dr. Briscoe Deutscher

## 2022-12-10 ENCOUNTER — Encounter
Payer: Medicaid Other | Attending: Physical Medicine and Rehabilitation | Admitting: Physical Medicine and Rehabilitation

## 2023-03-24 DIAGNOSIS — H11442 Conjunctival cysts, left eye: Secondary | ICD-10-CM | POA: Diagnosis not present

## 2023-03-24 DIAGNOSIS — H18613 Keratoconus, stable, bilateral: Secondary | ICD-10-CM | POA: Diagnosis not present

## 2023-04-06 ENCOUNTER — Encounter: Payer: Self-pay | Admitting: Student

## 2023-04-06 ENCOUNTER — Ambulatory Visit (INDEPENDENT_AMBULATORY_CARE_PROVIDER_SITE_OTHER): Payer: Medicaid Other | Admitting: Student

## 2023-04-06 VITALS — BP 100/68 | HR 60 | Ht 66.0 in | Wt 216.4 lb

## 2023-04-06 DIAGNOSIS — Z3169 Encounter for other general counseling and advice on procreation: Secondary | ICD-10-CM

## 2023-04-06 DIAGNOSIS — G8929 Other chronic pain: Secondary | ICD-10-CM | POA: Diagnosis not present

## 2023-04-06 DIAGNOSIS — R102 Pelvic and perineal pain: Secondary | ICD-10-CM

## 2023-04-06 DIAGNOSIS — M5431 Sciatica, right side: Secondary | ICD-10-CM | POA: Diagnosis not present

## 2023-04-06 DIAGNOSIS — Z13228 Encounter for screening for other metabolic disorders: Secondary | ICD-10-CM | POA: Diagnosis not present

## 2023-04-06 DIAGNOSIS — A539 Syphilis, unspecified: Secondary | ICD-10-CM

## 2023-04-06 MED ORDER — MEDI-PATCH-LIDOCAINE 0.5-0.035-5-20 % EX PTCH: MEDICATED_PATCH | CUTANEOUS | 0 refills | Status: DC

## 2023-04-06 MED ORDER — CYCLOBENZAPRINE HCL 10 MG PO TABS: 10.0000 mg | ORAL_TABLET | Freq: Two times a day (BID) | ORAL | 0 refills | Status: DC | PRN

## 2023-04-06 NOTE — Assessment & Plan Note (Signed)
" >>  ASSESSMENT AND PLAN FOR SCIATICA OF RIGHT SIDE WRITTEN ON 04/06/2023  5:29 PM BY Kaden Daughdrill, MD  Chronic pain radiating to lower back and down the leg. Pelvic ultrasound revealed a small cyst on the right side. No history of physical therapy for this issue. -Continue over-the-counter pain management with ibuprofen or Tylenol as needed. -Prescribe muscle relaxer for trial use, initially at night due to potential sedative effects. -Recommend at-home stretches to alleviate symptoms. -Consider future referral to physical therapy if patient's schedule allows and symptoms persist. -Proper lifting techniques provided  "

## 2023-04-06 NOTE — Assessment & Plan Note (Signed)
Chronic pain radiating to lower back and down the leg. Pelvic ultrasound revealed a small cyst on the right side. No history of physical therapy for this issue. -Continue over-the-counter pain management with ibuprofen or Tylenol as needed. -Prescribe muscle relaxer for trial use, initially at night due to potential sedative effects. -Recommend at-home stretches to alleviate symptoms. -Consider future referral to physical therapy if patient's schedule allows and symptoms persist. -Proper lifting techniques provided

## 2023-04-06 NOTE — Progress Notes (Signed)
SUBJECTIVE:   Chief compliant/HPI: annual examination  Chloe Sullivan is a 32 y.o. who presents today for an annual exam.   Right sided sciatica The pain has been ongoing for a while and is described as sharp, coming and going in nature. The pain is particularly severe during menstruation due to increased pressure. The patient manages the pain with over-the-counter ibuprofen or Tylenol as needed. She has not pursued physical therapy due to time constraints from work. Works in [employer], which may be exacerbating the sciatic pain.   Hx of syphilis The patient also reports occasional vaginal discharge and odor, but denies any recent episodes. She denies any new sexual partners and is not currently taking any medications apart from the over-the-counter pain relief.   Infertility The patient has been trying to conceive for the past six years without success and has not been able to follow up with an infertility doctor due to financial constraints.  She tries to exercise by walking around the block when possible, but finds it difficult in cold weather.  The patient smokes occasionally, about one cigarette every two to three weeks, and drinks alcohol socially. She denies any drug use.  OBJECTIVE:   BP 100/68   Pulse 60   Ht 5\' 6"  (1.676 m)   Wt 216 lb 6 oz (98.1 kg)   LMP 03/25/2023   SpO2 99%   BMI 34.92 kg/m   General: Well appearing, NAD, awake, alert, responsive to questions Head: Normocephalic atraumatic CV: Regular rate and rhythm no murmurs rubs or gallops Respiratory: Clear to ausculation bilaterally, no wheezes rales or crackles, chest rises symmetrically,  no increased work of breathing Abdomen: Soft, non-tender, non-distended, normoactive bowel sounds  Extremities: Moves upper and lower extremities freely, no edema in LE Back: No spinal process tenderness, +straight leg bilaterally Neuro: No focal deficits Skin: No rashes or lesions visualized  ASSESSMENT/PLAN:    Assessment & Plan Syphilis History of syphilis that was treated 04/14/2022.  Requests titer for following this.  Intermittent vaginal discharge but not all the symptoms currently.  Last titer was downtrending at 1: 2 -RPR with reflex to see what titer is. Encounter for screening for other metabolic disorders - HIV antibody (with reflex) - Hepatitis C antibody (reflex, frozen specimen) - Hemoglobin A1c - Lipid Panel  Sciatica of right side Chronic pain radiating to lower back and down the leg. Pelvic ultrasound revealed a small cyst on the right side. No history of physical therapy for this issue. -Continue over-the-counter pain management with ibuprofen or Tylenol as needed. -Prescribe muscle relaxer for trial use, initially at night due to potential sedative effects. -Recommend at-home stretches to alleviate symptoms. -Consider future referral to physical therapy if patient's schedule allows and symptoms persist. -Proper lifting techniques provided  Infertility counseling No current use of birth control, attempting to conceive for the past six years. No current follow-up with infertility OB due to cost concerns.  -Discussed reaching out if finding infertility specialist that is more affordable -Consider clomiphene prescription here if semen analysis was obtained   Annual Examination  See AVS for age appropriate recommendations.   PHQ score  Flowsheet Row Office Visit from 04/06/2023 in [facility] [place]  PHQ-9 Total Score 0      , reviewed and discussed. Blood pressure reviewed and at goal.  Folate recommended as appropriate, minimum of 400 mcg per day.   Considered the following items based upon  USPSTF recommendations: HIV testing: ordered for next visit Hepatitis C: ordered for next visit Syphilis if at high risk: ordered GC/CT not ordered Lipid panel (nonfasting or fasting) discussed based upon AHA recommendations and  ordered.  Consider repeat every 4-6 years.   No family hx of breast cancer Cervical cancer screening: prior Pap reviewed, repeat due in 12/05/2025 Immunizations, declines flu test   Follow up in 1  year or sooner if indicated.   Levin Erp, MD [facility]

## 2023-04-06 NOTE — Patient Instructions (Addendum)
It was great to see you! Thank you for allowing me to participate in your care!   I recommend that you always bring your medications to each appointment as this makes it easy to ensure we are on the correct medications and helps [place] not miss when refills are needed.  - sent in flexeril and lidocaine patches  Knee rolls While lying on your back with bent knees, slowly drop your legs to one side and hold for five seconds. Repeat on the other side, and try to do 3-5 sets.  Lying knee-to-chest stretch While lying on your back with extended legs, gently pull one knee toward your chest until you feel a stretch in your lower back and hip. Hold for 5-30 seconds, then slowly lower your leg.  Nerve glide While lying on your back, hook your hands behind the knee of the affected leg and slowly extend your knee until you feel pain, numbness, or tingling. Hold for a couple of seconds, then slowly lower your leg. Repeat, and you should be able to extend your leg further each time.  Standing hamstring stretch While standing, put one foot on a higher surface, straighten the leg, and lean forward slightly. Hold for 20-30 seconds, then repeat with the other leg.  Pelvic tilt While lying on your back with bent legs, tighten your stomach muscles and press your back into the floor. Then, rock your hips and pelvis slightly upward.   Our plans for today:  Today at your annual preventive visit we talked about the following measures:   I recommend 150 minutes of exercise per week-try 30 minutes 5 days per week We discussed reducing sugary beverages (like soda and juice) and increasing leafy greens and whole fruits.  We discussed avoiding tobacco and alcohol.  I recommend avoiding illicit substances.  Your blood pressure is at goal  We are checking some labs today, I will call you if they are abnormal will send you a MyChart message or a letter if they are normal.  If you do not hear about your labs in the next 2  weeks please let us know.  Take care and seek immediate care sooner if you develop any concerns. Please remember to show up 15 minutes before your scheduled appointment time!  Levin Erp, MD [facility]

## 2023-04-06 NOTE — Assessment & Plan Note (Signed)
History of syphilis that was treated 04/14/2022.  Requests titer for following this.  Intermittent vaginal discharge but not all the symptoms currently.  Last titer was downtrending at 1: 2 -RPR with reflex to see what titer is.

## 2023-04-07 DIAGNOSIS — H18613 Keratoconus, stable, bilateral: Secondary | ICD-10-CM | POA: Diagnosis not present

## 2023-04-07 LAB — RPR, QUANT: RPR, Quant: 1:1 {titer} — ABNORMAL HIGH

## 2023-04-07 LAB — RPR W/REFLEX TO TREPSURE: RPR: REACTIVE — AB

## 2023-04-23 ENCOUNTER — Other Ambulatory Visit: Payer: Medicaid Other

## 2023-04-23 DIAGNOSIS — Z13228 Encounter for screening for other metabolic disorders: Secondary | ICD-10-CM | POA: Diagnosis not present

## 2023-04-23 DIAGNOSIS — A539 Syphilis, unspecified: Secondary | ICD-10-CM | POA: Diagnosis not present

## 2023-04-24 LAB — HCV INTERPRETATION

## 2023-04-24 LAB — LIPID PANEL
Chol/HDL Ratio: 3.2 {ratio} (ref 0.0–4.4)
Cholesterol, Total: 182 mg/dL (ref 100–199)
HDL: 57 mg/dL (ref >39)
LDL Chol Calc (NIH): 92 mg/dL (ref 0–99)
Triglycerides: 198 mg/dL — ABNORMAL HIGH (ref 0–149)
VLDL Cholesterol Cal: 33 mg/dL (ref 5–40)

## 2023-04-24 LAB — HIV ANTIBODY (ROUTINE TESTING W REFLEX): HIV Screen 4th Generation wRfx: NONREACTIVE

## 2023-04-24 LAB — HEMOGLOBIN A1C
Est. average glucose Bld gHb Est-mCnc: 108 mg/dL
Hgb A1c MFr Bld: 5.4 % (ref 4.8–5.6)

## 2023-04-24 LAB — HCV AB W REFLEX TO QUANT PCR: HCV Ab: NONREACTIVE

## 2023-05-04 ENCOUNTER — Ambulatory Visit: Payer: Medicaid Other | Admitting: Family Medicine

## 2023-05-04 ENCOUNTER — Encounter: Payer: Self-pay | Admitting: Family Medicine

## 2023-05-04 VITALS — BP 110/80 | HR 94 | Wt 222.0 lb

## 2023-05-04 DIAGNOSIS — N912 Amenorrhea, unspecified: Secondary | ICD-10-CM | POA: Diagnosis not present

## 2023-05-04 DIAGNOSIS — N979 Female infertility, unspecified: Secondary | ICD-10-CM | POA: Diagnosis present

## 2023-05-04 LAB — POCT URINE PREGNANCY: Preg Test, Ur: NEGATIVE

## 2023-05-04 NOTE — Patient Instructions (Addendum)
 Your pregnancy test is negative. Since you are having symptoms and are 2 days late on your period, I would recommend taking another test next week. Let me know the results. IN the meantime, take a prenatal vitamin daily and only take tylenol for pain. I would try not to take flexeril  unless absolutely needed for the muscle pain.

## 2023-05-05 DIAGNOSIS — N979 Female infertility, unspecified: Secondary | ICD-10-CM | POA: Insufficient documentation

## 2023-05-05 NOTE — Assessment & Plan Note (Addendum)
 Pregnancy test in office today negative.  With symptoms of breast tenderness and nausea/vomiting, recommended repeating urine pregnancy test in 1 week at home and letting us  know results. In the meantime, discussed starting prenatal vitamin, not using NSAIDs for pain, and using flexeril  only if absolutely needed given lack of robust data for use in pregnancy.

## 2023-05-05 NOTE — Progress Notes (Signed)
    SUBJECTIVE:   CHIEF COMPLAINT / HPI:   Pregnancy test Has been trying to conceive for the past 6 years without success.  Unable to follow with fertility doctor due to financial constraints.  Coming in today for pregnancy test, however, because she feels she has been having some more breast tenderness and a little nausea/vomiting.  She is 2 days past her period.  She took a urine pregnancy test at home 4 days ago and felt there was a very faint line that showed up.  PERTINENT  PMH / PSH: Migraines, ovarian cyst, sciatica, amenorrhea, back muscle spasm, syphilis  OBJECTIVE:   BP 110/80   Pulse 94   Wt 222 lb (100.7 kg)   LMP 03/25/2023   SpO2 98%   BMI 35.83 kg/m   General: Alert and oriented, in NAD Skin: Warm, dry, and intact HEENT: NCAT, EOM grossly normal, midline nasal septum Cardiac: Regular rate Respiratory: Breathing and speaking comfortably on RA Extremities: Moves all extremities grossly equally Neurological: No gross focal deficit Psychiatric: Appropriate mood and affect   ASSESSMENT/PLAN:   Infertility, female Pregnancy test in office today negative.  With symptoms of breast tenderness and nausea/vomiting, recommended repeating urine pregnancy test in 1 week at home and letting us  know results. In the meantime, discussed starting prenatal vitamin, not using NSAIDs for pain, and using flexeril  only if absolutely needed given lack of robust data for use in pregnancy.   Stuart Redo, MD [facility]

## 2023-09-14 DIAGNOSIS — H04123 Dry eye syndrome of bilateral lacrimal glands: Secondary | ICD-10-CM | POA: Diagnosis not present

## 2023-09-14 DIAGNOSIS — H18613 Keratoconus, stable, bilateral: Secondary | ICD-10-CM | POA: Diagnosis not present

## 2023-09-15 ENCOUNTER — Ambulatory Visit: Admitting: Student

## 2023-10-06 ENCOUNTER — Encounter: Payer: Self-pay | Admitting: *Deleted

## 2023-10-12 DIAGNOSIS — H18613 Keratoconus, stable, bilateral: Secondary | ICD-10-CM | POA: Diagnosis not present

## 2023-10-12 DIAGNOSIS — H04123 Dry eye syndrome of bilateral lacrimal glands: Secondary | ICD-10-CM | POA: Diagnosis not present

## 2024-03-08 ENCOUNTER — Ambulatory Visit: Admitting: Family Medicine

## 2024-03-08 ENCOUNTER — Encounter: Payer: Self-pay | Admitting: Family Medicine

## 2024-03-08 VITALS — BP 130/81 | HR 75 | Ht 66.0 in | Wt 239.6 lb

## 2024-03-08 DIAGNOSIS — M654 Radial styloid tenosynovitis [de Quervain]: Secondary | ICD-10-CM

## 2024-03-08 NOTE — Patient Instructions (Signed)
 It was so good to see you today! Thank you for allowing me to take care of you.  Today we discussed the following concerns and plans:  I have referred you to sports medicine - they should call you to make an appointment.  If you have any concerns, please call the clinic or schedule an appointment.  It was a pleasure to take care of you today. Be well!  Lauraine Norse, DO [facility], PGY-2  Do you need your medications delivered to your home?   We'll send your prescription to the [facility] for delivery.          Address: [address] [place], [place], [place] [address]          Phone: [phone]  Please call the [facility] to speak with a pharmacist and set up your home medication delivery. If you have any questions, feel free to contact us  -- we're happy to help!  Other [facility] that offer affordable prices on both prescriptions and over-the-counter items, as well as convenient services like vaccinations, are  [facility], at [facility]         Address:  [address], [place], [place] [address]         Phone: [phone]  [facility], located in the [facility]        Address: [address] [place], [place] [address]        Phone: [phone]  [facility], at [facility]       Address: [address], [place], [place] [address]       Phone: [phone]  [facility], at [facility]       Address: [address], [place], [place] [address]       Phone: [phone]
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
## 2024-03-22 ENCOUNTER — Ambulatory Visit (INDEPENDENT_AMBULATORY_CARE_PROVIDER_SITE_OTHER): Admitting: Family Medicine

## 2024-03-22 ENCOUNTER — Encounter: Payer: Self-pay | Admitting: Family Medicine

## 2024-03-22 VITALS — BP 135/82 | Ht 66.0 in | Wt 230.0 lb

## 2024-03-22 DIAGNOSIS — G5603 Carpal tunnel syndrome, bilateral upper limbs: Secondary | ICD-10-CM

## 2024-03-22 MED ORDER — NAPROXEN 500 MG PO TABS: 500.0000 mg | ORAL_TABLET | Freq: Two times a day (BID) | ORAL | 1 refills | Status: DC | PRN
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
## 2024-03-22 NOTE — Patient Instructions (Signed)
 It was a pleasure seeing you today! As discussed, I'd like to follow up with you with some more information and exercises that I hope is helpful to you and your recovery: You have: Carpal Tunnel Syndrome (CTS)  You should do the exercises as directed on a daily basis  You should continue to use the wrist splint.  You should wear this at bedtime for the next 2-4 weeks, then as needed.  You can also  wear during the day as needed.  I sent a prescription for Naproxen  500mg  take 1 tab twice a day with food for the next 10-14 days, then as needed.  Do no take with any other anti-inflammatories(ibuprofen, motrin, advil, aleve )  You should follow-up with me in 6 weeks to reassess or sooner as needed   Wishing you a speedy recovery! Best, Rainell Cedar, DO

## 2024-03-23 ENCOUNTER — Ambulatory Visit

## 2024-04-07 ENCOUNTER — Ambulatory Visit

## 2024-04-07 VITALS — BP 119/69 | HR 69 | Ht 66.0 in | Wt 243.6 lb

## 2024-04-07 DIAGNOSIS — R102 Pelvic and perineal pain unspecified side: Secondary | ICD-10-CM

## 2024-04-07 DIAGNOSIS — M5431 Sciatica, right side: Secondary | ICD-10-CM | POA: Diagnosis not present

## 2024-04-07 DIAGNOSIS — G8929 Other chronic pain: Secondary | ICD-10-CM

## 2024-04-07 LAB — POCT URINE PREGNANCY: Preg Test, Ur: NEGATIVE

## 2024-04-07 MED ORDER — GABAPENTIN 100 MG PO CAPS: 100.0000 mg | ORAL_CAPSULE | Freq: Three times a day (TID) | ORAL | 0 refills | Status: DC

## 2024-04-07 NOTE — Progress Notes (Unsigned)
° ° °  SUBJECTIVE:   Chief compliant/HPI: annual examination  Dedra Matsuo is a 33 y.o. who presents today for an annual exam.   Reviewed and updated history.    Sciatica Patient reports history of sciatica with burning pain from the lower back down the R leg. This sometimes radiates to the R groin area and feels like pressure. States she takes Ibuprofen 600mg  about 3x.week which helps to calm the pain. Reports history of ectopic pregnancy on the R which was resolved with medication. She asks if this may be contributing.  OBJECTIVE:   BP 119/69   Pulse 69   Ht 5' 6 (1.676 m)   Wt 243 lb 9.6 oz (110.5 kg)   LMP 02/29/2024   SpO2 100%   BMI 39.32 kg/m    General: Alert, well-appearing female in NAD.  HEENT: No sign of trauma, EOM grossly intact. Neck: Supple, normal ROM Cardiovascular: RRR, no m/r/g appreciated.  Pulmonary: Normal WOB. CTAB with no w/c/r present. Abdomen: Soft, non-tender, non-distended. Extremities: Warm and well-perfused, without cyanosis or edema. Positive straight leg raise on the R. Neurologic: Normal gait, moves all four extremities appropriately Skin: No rashes or lesions. Psych: Appropriate mood and affect  ASSESSMENT/PLAN:   Assessment & Plan Pelvic pain Sciatica of right side Positive straight leg raise on the R. Exam otherwise normal. Burning pain that travels down the back of the leg and exam consistent with sciatica. Reasonable to order Upreg to rule out pregnancy as cause.  - Ordered Upreg which was negative and shared with patient. Do no suspect this is contributing. - Start Gabapentin  100mg  TID - Follow up has been scheduled with me for 05/12/24 at 4:10 PMto f/u on pain   Annual Examination  See AVS for age appropriate recommendations  PHQ score 0, reviewed and discussed.  Blood pressure reviewed and at goal.   Advanced directive not discussed   Considered the following items based upon USPSTF recommendations: Diabetes screening:  previously completed and result reviewed, normal  HIV testing: previously completed and result reviewed, normal  Hepatitis C: previously completed and result reviewed, normal  Hepatitis B: previously completed and result reviewed, normal  Syphilis if at high risk: history of syphilis with most recent RPR quant showing 1:1. Will not repeat today GC/CT not at high risk and not ordered. Lipid panel (nonfasting or fasting) discussed based upon AHA recommendations and completed 03/2023 with elevated triglycerides. Patient instructed on diet at the time. Suspect this was elevated as this was not fasting. Discussed with patient and she would prefer to hold of on repeating today.  Consider repeat every 4-6 years.  Reviewed risk factors for latent tuberculosis and not indicated Vaccinations Declined flu vaccine.   Follow up in 1 year or sooner if indicated.  MyChart Activation: Already signed up  Darren Jernigan, DO [facility]

## 2024-04-07 NOTE — Patient Instructions (Signed)
 Thank you for visiting the clinic today, it was good to see you!  Our plans for today: - Take Gabapentin 100 mg 3 times per day for sciatica.  - We got a urine pregnancy test today  Please follow-up on 05/12/24 at 4:10 PM. Make sure to bring ALL of your medications with you to every visit.   Please arrive 15 minutes PRIOR to your next scheduled appointment time! If you do not, this affects OTHER patients' care.  For any questions, please call the office at [phone] or send me a message in MyChart.  It was a pleasure to take care of you today. Have a great day!  Alika Eppes, DO [facility] Resident, PGY-1  -----------------------------------------------------------------------------  Do you need your medications delivered to your home?   Well send your prescription to the [facility] for delivery.          Address: [address] [place], [place], [place] [address]          Phone: [phone]  Please call the [facility] to speak with a pharmacist and set up your home medication delivery. If you have any questions, feel free to contact us  -- were happy to help!  Other  Pharmacies that offer affordable prices on both prescriptions and over-the-counter items, as well as convenient services like vaccinations, are  [facility], at [facility]         Address: [address], [place], [place] [address]         Phone: [phone]  [facility], located in the [facility]        Address: [address] [place], [place] [address]        Phone: [phone]  [facility], at [facility]       Address: [address], [place], [place] [address]       Phone: [phone]  [facility], at [facility]       Address: [address], [place], [place] [address]       Phone:  [phone]  -----------------------------------------------------------------------------  Food Resources SNAP/ Food benefits - [place] [phone] [place]: [address] [place]: [address] [facility]) Nutrition program [place]: [phone] [place]: [phone] [facility] Helping [facility] - Free Produce & Food (Every Thursday 9AM to 11:00AM) [employer] [id number], [place], [place]: [address]; [phone] [place]: [address]; [phone]  The [facility] app, formally [employer], connects those living in [place], [place] & surrounding areas with healthy food options & access to emergency resources. This app aims to alleviate some of the barriers to food access by making the many ways people in [place], [place] & surrounding areas can access food resources publicly available.    This app is the product of the [employer], whose mission is to coordinate and improve the effectiveness of entities in [place] [employer] focused on alleviating hunger by creating and executing citywide and neighborhood-focused initiatives to develop more just and sustainable food systems.

## 2024-04-10 NOTE — Assessment & Plan Note (Signed)
" >>  ASSESSMENT AND PLAN FOR SCIATICA OF RIGHT SIDE WRITTEN ON 04/10/2024 12:59 PM BY Dione Mccombie, DO  Positive straight leg raise on the R. Exam otherwise normal. Burning pain that travels down the back of the leg and exam consistent with sciatica. Reasonable to order Upreg to rule out pregnancy as cause.  - Ordered Upreg which was negative and shared with patient. Do no suspect this is contributing. - Start Gabapentin  100mg  TID - Follow up has been scheduled with me for 05/12/24 at 4:10 PMto f/u on pain "

## 2024-04-10 NOTE — Assessment & Plan Note (Signed)
 Positive straight leg raise on the R. Exam otherwise normal. Burning pain that travels down the back of the leg and exam consistent with sciatica. Reasonable to order Upreg to rule out pregnancy as cause.  - Ordered Upreg which was negative and shared with patient. Do no suspect this is contributing. - Start Gabapentin  100mg  TID - Follow up has been scheduled with me for 05/12/24 at 4:10 PMto f/u on pain

## 2024-05-03 ENCOUNTER — Ambulatory Visit: Admitting: Family Medicine

## 2024-05-04 ENCOUNTER — Ambulatory Visit: Admitting: Internal Medicine

## 2024-05-04 ENCOUNTER — Other Ambulatory Visit: Payer: Self-pay

## 2024-05-04 ENCOUNTER — Encounter: Payer: Self-pay | Admitting: Internal Medicine

## 2024-05-04 VITALS — BP 124/88 | Ht 66.0 in | Wt 236.0 lb

## 2024-05-04 DIAGNOSIS — M25531 Pain in right wrist: Secondary | ICD-10-CM

## 2024-05-04 DIAGNOSIS — G5601 Carpal tunnel syndrome, right upper limb: Secondary | ICD-10-CM | POA: Diagnosis not present

## 2024-05-04 MED ORDER — METHYLPREDNISOLONE ACETATE 40 MG/ML IJ SUSP
40.0000 mg | Freq: Once | INTRAMUSCULAR | Status: AC
  Administered 2024-05-04: 40 mg via INTRA_ARTICULAR

## 2024-05-04 NOTE — Progress Notes (Addendum)
 "  Established Patient Office Visit  PCP: Bhagat, Virali, DO  Patient is a 34 y.o. female here for follow-up of right hand/wrist pain and numbness.  Last evaluated by Dr. Teressa on 11/25.  Her history and exam findings were concerning for carpal tunnel syndrome.  She was prescribed naproxen  and told to use a wrist brace at bedtime.  She was also given a home exercise program and told to follow-up in 6 weeks for reassessment.  Today she reports that her symptoms have not significantly improved.  They occur less frequently at night while wearing a wrist brace.  She continues to describe pain and numbness along the first 3 digits of the right hand.  She also describes some discomfort along the radial aspect of the right wrist that is worse with lifting objects.  She denies any symptoms in the left hand or wrist currently.  Past Medical History:  Diagnosis Date   Medical history non-contributory     Medications Ordered Prior to Encounter[1]  Past Surgical History:  Procedure Laterality Date   NO PAST SURGERIES      Allergies[2]  BP 124/88   Ht 5' 6 (1.676 m)   Wt 236 lb (107 kg)   BMI 38.09 kg/m       No data to display              No data to display              Objective:  Physical Exam:  Gen: NAD, comfortable in exam room  Right hand/wrist No deformity noted on inspection, no thenar atrophy appreciated Full ROM of the wrist with 5/5 grip strength, no discrepancy compared to the left wrist TTP along the radial aspect of the wrist Positive Phalen's, equivocal Tinel's and Finkelstein's Grossly NV intact distally  Limited MSK US : Right Wrist US  evaluation of the right wrist shows normal appearance of the 1st and 2nd dorsal compartments with no findings suggestive of tenosynovitis.  Measurement of the area of the median nerve in close proximity to the pisiform is 0.12 cm.  Assessment and Plan:  Right Carpal Tunnel Syndrome  The patient's exam, history, and US   findings today  continued to suggest carpal tunnel syndrome as underlying cause of her discomfort.  No significant improvement with bracing and NSAIDs, though she does endorse a reduction in the frequency of nocturnal symptoms while a wrist brace. - Additional treatment options reviewed today, including injection, nerve conduction testing, or a referral to orthopedic surgery to discuss surgical options.  Ultimately, she elected to proceed with US -guided corticosteroid injection.  This was completed without complication.  See procedural documentation below.   -She will follow-up in 1 month for reassessment.   -I recommended that she continue bracing at night and use NSAIDs as needed for pain relief  Procedure: After informed written consent timeout was performed, patient was seated on exam table.  Area overlying right carpal tunnel prepped with alcohol swab then utilizing ultrasound guidance, patient's right carpal tunnel injected with 1:1 lidocaine : depomedrol. Patient tolerated procedure well without immediate complications. Bandaid applied after the procedure.   Manus FORBES Fireman, MD      [1]  Current Outpatient Medications on File Prior to Visit  Medication Sig Dispense Refill   cyclobenzaprine  (FLEXERIL ) 10 MG tablet Take 1 tablet (10 mg total) by mouth 2 (two) times daily as needed for muscle spasms. 30 tablet 0   gabapentin  (NEURONTIN ) 100 MG capsule Take 1 capsule (100 mg total) by mouth  3 (three) times daily. 30 capsule 0   naproxen  (NAPROSYN ) 500 MG tablet Take 1 tablet (500 mg total) by mouth 2 (two) times daily as needed. 60 tablet 1   No current facility-administered medications on file prior to visit.  [2] No Known Allergies  "

## 2024-05-12 ENCOUNTER — Ambulatory Visit: Payer: Self-pay

## 2024-05-20 ENCOUNTER — Ambulatory Visit

## 2024-05-20 VITALS — BP 114/74 | HR 69 | Ht 66.0 in | Wt 243.4 lb

## 2024-05-20 DIAGNOSIS — M5416 Radiculopathy, lumbar region: Secondary | ICD-10-CM

## 2024-05-20 MED ORDER — GABAPENTIN 100 MG PO CAPS: 300.0000 mg | ORAL_CAPSULE | Freq: Three times a day (TID) | ORAL | 0 refills | Status: AC

## 2024-05-20 NOTE — Patient Instructions (Signed)
-   increase gabapentin  to 300 mg 3 times per day - consider physical therapy if your symptoms do not improve
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
## 2024-05-21 NOTE — Assessment & Plan Note (Signed)
 Exam and presentation consistent with R-sided lumbar radiculopathy. Taking Gabapentin  100mg  BID which is not helping. Discussed increasing dose vs referral to PT and patient would prefer to try increasing dose at this time. Her schedule is not compatible with PT but she will consider if no relief with increased dose. - Increased gabapentin  to 300 mg TID - Return if symptoms do not improve

## 2024-05-30 ENCOUNTER — Other Ambulatory Visit: Payer: Self-pay

## 2024-05-30 ENCOUNTER — Emergency Department (HOSPITAL_COMMUNITY)
Admission: EM | Admit: 2024-05-30 | Discharge: 2024-05-30 | Disposition: A | Discharge status: Home / Self Care | Attending: Emergency Medicine | Admitting: Emergency Medicine

## 2024-05-30 ENCOUNTER — Encounter (HOSPITAL_COMMUNITY): Payer: Self-pay | Admitting: Emergency Medicine

## 2024-05-30 ENCOUNTER — Emergency Department (HOSPITAL_COMMUNITY)

## 2024-05-30 DIAGNOSIS — R350 Frequency of micturition: Secondary | ICD-10-CM | POA: Insufficient documentation

## 2024-05-30 DIAGNOSIS — M545 Low back pain, unspecified: Secondary | ICD-10-CM | POA: Insufficient documentation

## 2024-05-30 DIAGNOSIS — R1031 Right lower quadrant pain: Secondary | ICD-10-CM | POA: Insufficient documentation

## 2024-05-30 LAB — URINALYSIS, ROUTINE W REFLEX MICROSCOPIC
Bilirubin Urine: NEGATIVE
Glucose, UA: NEGATIVE mg/dL
Hgb urine dipstick: NEGATIVE
Ketones, ur: NEGATIVE mg/dL
Leukocytes,Ua: NEGATIVE
Nitrite: NEGATIVE
Protein, ur: NEGATIVE mg/dL
Specific Gravity, Urine: 1.026 (ref 1.005–1.030)
pH: 6 (ref 5.0–8.0)

## 2024-05-30 LAB — COMPREHENSIVE METABOLIC PANEL WITH GFR
ALT: 15 U/L (ref 0–44)
AST: 20 U/L (ref 15–41)
Albumin: 4.1 g/dL (ref 3.5–5.0)
Alkaline Phosphatase: 97 U/L (ref 38–126)
Anion gap: 12 (ref 5–15)
BUN: 22 mg/dL — ABNORMAL HIGH (ref 6–20)
CO2: 25 mmol/L (ref 22–32)
Calcium: 9.3 mg/dL (ref 8.9–10.3)
Chloride: 103 mmol/L (ref 98–111)
Creatinine, Ser: 0.69 mg/dL (ref 0.44–1.00)
GFR, Estimated: >60 mL/min
Glucose, Bld: 102 mg/dL — ABNORMAL HIGH (ref 70–99)
Potassium: 4.1 mmol/L (ref 3.5–5.1)
Sodium: 140 mmol/L (ref 135–145)
Total Bilirubin: 0.3 mg/dL (ref 0.0–1.2)
Total Protein: 7.2 g/dL (ref 6.5–8.1)

## 2024-05-30 LAB — HCG, SERUM, QUALITATIVE: Preg, Serum: NEGATIVE

## 2024-05-30 LAB — CBC
HCT: 42.7 % (ref 36.0–46.0)
Hemoglobin: 14.1 g/dL (ref 12.0–15.0)
MCH: 27.9 pg (ref 26.0–34.0)
MCHC: 33 g/dL (ref 30.0–36.0)
MCV: 84.4 fL (ref 80.0–100.0)
Platelets: 270 10*3/uL (ref 150–400)
RBC: 5.06 MIL/uL (ref 3.87–5.11)
RDW: 13.1 % (ref 11.5–15.5)
WBC: 8.6 10*3/uL (ref 4.0–10.5)
nRBC: 0 % (ref 0.0–0.2)

## 2024-05-30 NOTE — ED Triage Notes (Signed)
 Pt coming in reporting pain in  her right side. Pt reports a fall a few days ago.

## 2024-05-30 NOTE — ED Notes (Signed)
 Pt returned from US 

## 2024-05-30 NOTE — ED Triage Notes (Signed)
 Pt reports she had a fall on ice about 1 week ago. Fell onto her right knee. Sts the next day she started having right lower abdomen described as sharp/burning pain that radiating into her right lower back and down the right knee. Pain worsening with movement. Associated with urinary frequency. No pain on palpation. No NVD. Ambulatory in triage without difficulty. LMP approx 1 month ago. Hx of ectopic pregnancy. Not on birth control - currently attempting to get pregnant.

## 2024-05-30 NOTE — ED Notes (Signed)
 Patient transported to US
